# Patient Record
Sex: Male | Born: 1975 | Race: White | Hispanic: No | Marital: Married | Smoking: Current every day smoker
Health system: Southern US, Community
[De-identification: ages and names within clinical notes are randomized; demographics above are authoritative.]

## PROBLEM LIST (undated history)

## (undated) DIAGNOSIS — R161 Splenomegaly, not elsewhere classified: Secondary | ICD-10-CM

## (undated) DIAGNOSIS — D869 Sarcoidosis, unspecified: Secondary | ICD-10-CM

## (undated) DIAGNOSIS — K859 Acute pancreatitis without necrosis or infection, unspecified: Secondary | ICD-10-CM

## (undated) DIAGNOSIS — I10 Essential (primary) hypertension: Secondary | ICD-10-CM

## (undated) DIAGNOSIS — I219 Acute myocardial infarction, unspecified: Secondary | ICD-10-CM

## (undated) DIAGNOSIS — N261 Atrophy of kidney (terminal): Secondary | ICD-10-CM

## (undated) DIAGNOSIS — K219 Gastro-esophageal reflux disease without esophagitis: Secondary | ICD-10-CM

## (undated) DIAGNOSIS — I2510 Atherosclerotic heart disease of native coronary artery without angina pectoris: Secondary | ICD-10-CM

## (undated) DIAGNOSIS — W3400XA Accidental discharge from unspecified firearms or gun, initial encounter: Secondary | ICD-10-CM

## (undated) DIAGNOSIS — G459 Transient cerebral ischemic attack, unspecified: Secondary | ICD-10-CM

## (undated) DIAGNOSIS — F419 Anxiety disorder, unspecified: Secondary | ICD-10-CM

## (undated) DIAGNOSIS — N289 Disorder of kidney and ureter, unspecified: Secondary | ICD-10-CM

## (undated) HISTORY — PX: CHOLECYSTECTOMY: SHX55

## (undated) HISTORY — PX: CARDIAC CATHETERIZATION: SHX172

## (undated) HISTORY — PX: LEG SURGERY: SHX1003

---

## 2014-06-10 ENCOUNTER — Emergency Department (HOSPITAL_COMMUNITY): Payer: Non-veteran care

## 2014-06-10 ENCOUNTER — Encounter (HOSPITAL_COMMUNITY): Payer: Self-pay | Admitting: Emergency Medicine

## 2014-06-10 ENCOUNTER — Observation Stay (HOSPITAL_COMMUNITY)
Admission: EM | Admit: 2014-06-10 | Discharge: 2014-06-11 | Disposition: A | Discharge status: Home / Self Care | Payer: Non-veteran care | Attending: Internal Medicine | Admitting: Internal Medicine

## 2014-06-10 DIAGNOSIS — R109 Unspecified abdominal pain: Secondary | ICD-10-CM

## 2014-06-10 DIAGNOSIS — N281 Cyst of kidney, acquired: Secondary | ICD-10-CM | POA: Insufficient documentation

## 2014-06-10 DIAGNOSIS — N289 Disorder of kidney and ureter, unspecified: Secondary | ICD-10-CM | POA: Diagnosis not present

## 2014-06-10 DIAGNOSIS — Z79899 Other long term (current) drug therapy: Secondary | ICD-10-CM | POA: Diagnosis not present

## 2014-06-10 DIAGNOSIS — Z8673 Personal history of transient ischemic attack (TIA), and cerebral infarction without residual deficits: Secondary | ICD-10-CM | POA: Insufficient documentation

## 2014-06-10 DIAGNOSIS — R000 Tachycardia, unspecified: Secondary | ICD-10-CM | POA: Diagnosis not present

## 2014-06-10 DIAGNOSIS — I214 Non-ST elevation (NSTEMI) myocardial infarction: Secondary | ICD-10-CM

## 2014-06-10 DIAGNOSIS — I2510 Atherosclerotic heart disease of native coronary artery without angina pectoris: Secondary | ICD-10-CM | POA: Insufficient documentation

## 2014-06-10 DIAGNOSIS — F411 Generalized anxiety disorder: Secondary | ICD-10-CM | POA: Insufficient documentation

## 2014-06-10 DIAGNOSIS — R079 Chest pain, unspecified: Principal | ICD-10-CM

## 2014-06-10 DIAGNOSIS — E785 Hyperlipidemia, unspecified: Secondary | ICD-10-CM

## 2014-06-10 DIAGNOSIS — R55 Syncope and collapse: Secondary | ICD-10-CM

## 2014-06-10 DIAGNOSIS — I252 Old myocardial infarction: Secondary | ICD-10-CM | POA: Diagnosis not present

## 2014-06-10 DIAGNOSIS — R61 Generalized hyperhidrosis: Secondary | ICD-10-CM | POA: Diagnosis not present

## 2014-06-10 DIAGNOSIS — F17200 Nicotine dependence, unspecified, uncomplicated: Secondary | ICD-10-CM | POA: Insufficient documentation

## 2014-06-10 DIAGNOSIS — G459 Transient cerebral ischemic attack, unspecified: Secondary | ICD-10-CM

## 2014-06-10 DIAGNOSIS — Z905 Acquired absence of kidney: Secondary | ICD-10-CM

## 2014-06-10 HISTORY — DX: Anxiety disorder, unspecified: F41.9

## 2014-06-10 HISTORY — DX: Atherosclerotic heart disease of native coronary artery without angina pectoris: I25.10

## 2014-06-10 HISTORY — DX: Transient cerebral ischemic attack, unspecified: G45.9

## 2014-06-10 HISTORY — DX: Accidental discharge from unspecified firearms or gun, initial encounter: W34.00XA

## 2014-06-10 HISTORY — DX: Disorder of kidney and ureter, unspecified: N28.9

## 2014-06-10 LAB — CBC
HCT: 45.1 % (ref 39.0–52.0)
HEMATOCRIT: 42.2 % (ref 39.0–52.0)
Hemoglobin: 15.9 g/dL (ref 13.0–17.0)
Hemoglobin: 14.4 g/dL (ref 13.0–17.0)
MCH: 30 pg (ref 26.0–34.0)
MCH: 29 pg (ref 26.0–34.0)
MCHC: 35.3 g/dL (ref 30.0–36.0)
MCHC: 34.1 g/dL (ref 30.0–36.0)
MCV: 85.1 fL (ref 78.0–100.0)
MCV: 85.1 fL (ref 78.0–100.0)
PLATELETS: 244 10*3/uL (ref 150–400)
Platelets: 218 10*3/uL (ref 150–400)
RBC: 5.3 MIL/uL (ref 4.22–5.81)
RBC: 4.96 MIL/uL (ref 4.22–5.81)
RDW: 12.8 % (ref 11.5–15.5)
RDW: 12.8 % (ref 11.5–15.5)
WBC: 7.4 10*3/uL (ref 4.0–10.5)
WBC: 6.2 10*3/uL (ref 4.0–10.5)

## 2014-06-10 LAB — URINALYSIS, ROUTINE W REFLEX MICROSCOPIC
BILIRUBIN URINE: NEGATIVE
Glucose, UA: NEGATIVE mg/dL
KETONES UR: NEGATIVE mg/dL
LEUKOCYTES UA: NEGATIVE
NITRITE: NEGATIVE
PROTEIN: NEGATIVE mg/dL
Specific Gravity, Urine: 1.006 (ref 1.005–1.030)
UROBILINOGEN UA: 0.2 mg/dL (ref 0.0–1.0)
pH: 6 (ref 5.0–8.0)

## 2014-06-10 LAB — LIPID PANEL
Cholesterol: 163 mg/dL (ref 0–200)
HDL: 25 mg/dL — ABNORMAL LOW (ref >39)
LDL CALC: 87 mg/dL (ref 0–99)
TRIGLYCERIDES: 255 mg/dL — AB (ref <150)
Total CHOL/HDL Ratio: 6.5 RATIO
VLDL: 51 mg/dL — ABNORMAL HIGH (ref 0–40)

## 2014-06-10 LAB — I-STAT TROPONIN, ED: TROPONIN I, POC: 0 ng/mL (ref 0.00–0.08)

## 2014-06-10 LAB — CREATININE, SERUM: Creatinine, Ser: 1.02 mg/dL (ref 0.50–1.35)

## 2014-06-10 LAB — TSH: TSH: 2.34 u[IU]/mL (ref 0.350–4.500)

## 2014-06-10 LAB — RAPID URINE DRUG SCREEN, HOSP PERFORMED
Amphetamines: NOT DETECTED
BARBITURATES: NOT DETECTED
Benzodiazepines: NOT DETECTED
Cocaine: NOT DETECTED
Opiates: NOT DETECTED
Tetrahydrocannabinol: NOT DETECTED

## 2014-06-10 LAB — BASIC METABOLIC PANEL
ANION GAP: 16 — AB (ref 5–15)
BUN: 7 mg/dL (ref 6–23)
CHLORIDE: 101 meq/L (ref 96–112)
CO2: 22 mEq/L (ref 19–32)
Calcium: 9.9 mg/dL (ref 8.4–10.5)
Creatinine, Ser: 1 mg/dL (ref 0.50–1.35)
Glucose, Bld: 101 mg/dL — ABNORMAL HIGH (ref 70–99)
POTASSIUM: 3.6 meq/L — AB (ref 3.7–5.3)
SODIUM: 139 meq/L (ref 137–147)

## 2014-06-10 LAB — TROPONIN I: Troponin I: <0.3 ng/mL (ref <0.30)

## 2014-06-10 LAB — D-DIMER, QUANTITATIVE: D-Dimer, Quant: <0.27 ug/mL-FEU (ref 0.00–0.48)

## 2014-06-10 MED ORDER — VENLAFAXINE HCL ER 150 MG PO CP24
150.0000 mg | ORAL_CAPSULE | Freq: Every day | ORAL | Status: DC
  Administered 2014-06-11: 150 mg via ORAL
  Filled 2014-06-10 (×3): qty 1

## 2014-06-10 MED ORDER — ASPIRIN EC 325 MG PO TBEC
325.0000 mg | DELAYED_RELEASE_TABLET | Freq: Every day | ORAL | Status: DC
  Administered 2014-06-11: 325 mg via ORAL
  Filled 2014-06-10: qty 1

## 2014-06-10 MED ORDER — HEPARIN SODIUM (PORCINE) 5000 UNIT/ML IJ SOLN
5000.0000 [IU] | Freq: Three times a day (TID) | INTRAMUSCULAR | Status: DC
  Administered 2014-06-10: 5000 [IU] via SUBCUTANEOUS
  Filled 2014-06-10 (×4): qty 1

## 2014-06-10 MED ORDER — CLONAZEPAM 0.5 MG PO TABS
0.5000 mg | ORAL_TABLET | Freq: Every day | ORAL | Status: DC | PRN
  Administered 2014-06-10 – 2014-06-11 (×2): 0.5 mg via ORAL
  Filled 2014-06-10 (×2): qty 1

## 2014-06-10 MED ORDER — FUROSEMIDE 20 MG PO TABS
20.0000 mg | ORAL_TABLET | Freq: Every day | ORAL | Status: DC | PRN
  Filled 2014-06-10: qty 1

## 2014-06-10 MED ORDER — ATORVASTATIN CALCIUM 40 MG PO TABS
40.0000 mg | ORAL_TABLET | Freq: Every day | ORAL | Status: DC
  Filled 2014-06-10: qty 1

## 2014-06-10 MED ORDER — HYDROCODONE-ACETAMINOPHEN 7.5-325 MG PO TABS
1.0000 | ORAL_TABLET | Freq: Four times a day (QID) | ORAL | Status: DC | PRN
  Administered 2014-06-10 – 2014-06-11 (×2): 1 via ORAL
  Filled 2014-06-10 (×2): qty 1

## 2014-06-10 MED ORDER — ASPIRIN 81 MG PO CHEW
324.0000 mg | CHEWABLE_TABLET | Freq: Once | ORAL | Status: AC
  Administered 2014-06-10: 324 mg via ORAL
  Filled 2014-06-10: qty 4

## 2014-06-10 MED ORDER — VITAMIN D (ERGOCALCIFEROL) 1.25 MG (50000 UNIT) PO CAPS: 50000.0000 [IU] | ORAL_CAPSULE | ORAL | Status: DC

## 2014-06-10 MED ORDER — SODIUM CHLORIDE 0.9 % IJ SOLN
3.0000 mL | Freq: Two times a day (BID) | INTRAMUSCULAR | Status: DC
  Administered 2014-06-10: 3 mL via INTRAVENOUS

## 2014-06-10 MED ORDER — PANTOPRAZOLE SODIUM 40 MG PO TBEC
40.0000 mg | DELAYED_RELEASE_TABLET | Freq: Every day | ORAL | Status: DC
  Administered 2014-06-11: 40 mg via ORAL
  Filled 2014-06-10: qty 1

## 2014-06-10 MED ORDER — LISINOPRIL 20 MG PO TABS
20.0000 mg | ORAL_TABLET | Freq: Every day | ORAL | Status: DC
  Filled 2014-06-10 (×3): qty 1

## 2014-06-10 NOTE — ED Notes (Signed)
Security paged to escort pt to car to obtain cell phone. PA nicole made aware and states this is okay.
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
## 2014-06-10 NOTE — ED Notes (Signed)
Pt c/o left flank pain near kidney area into chest; pt sts hx of tachycardia; pt sts history of only one kidney
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
## 2014-06-10 NOTE — ED Notes (Signed)
Cardiology at bedside.

## 2014-06-11 DIAGNOSIS — G459 Transient cerebral ischemic attack, unspecified: Secondary | ICD-10-CM

## 2014-06-11 DIAGNOSIS — E785 Hyperlipidemia, unspecified: Secondary | ICD-10-CM

## 2014-06-11 DIAGNOSIS — I214 Non-ST elevation (NSTEMI) myocardial infarction: Secondary | ICD-10-CM

## 2014-06-11 DIAGNOSIS — R079 Chest pain, unspecified: Secondary | ICD-10-CM | POA: Diagnosis not present

## 2014-06-11 DIAGNOSIS — I517 Cardiomegaly: Secondary | ICD-10-CM

## 2014-06-11 DIAGNOSIS — Z905 Acquired absence of kidney: Secondary | ICD-10-CM

## 2014-06-11 DIAGNOSIS — R072 Precordial pain: Secondary | ICD-10-CM

## 2014-06-11 DIAGNOSIS — I252 Old myocardial infarction: Secondary | ICD-10-CM

## 2014-06-11 DIAGNOSIS — R109 Unspecified abdominal pain: Secondary | ICD-10-CM

## 2014-06-11 LAB — CBC WITH DIFFERENTIAL/PLATELET
Basophils Absolute: 0 10*3/uL (ref 0.0–0.1)
Basophils Relative: 1 % (ref 0–1)
EOS ABS: 0.1 10*3/uL (ref 0.0–0.7)
EOS PCT: 2 % (ref 0–5)
HCT: 42.5 % (ref 39.0–52.0)
Hemoglobin: 14.6 g/dL (ref 13.0–17.0)
LYMPHS ABS: 2.9 10*3/uL (ref 0.7–4.0)
Lymphocytes Relative: 45 % (ref 12–46)
MCH: 29.3 pg (ref 26.0–34.0)
MCHC: 34.4 g/dL (ref 30.0–36.0)
MCV: 85.3 fL (ref 78.0–100.0)
MONO ABS: 0.5 10*3/uL (ref 0.1–1.0)
Monocytes Relative: 7 % (ref 3–12)
Neutro Abs: 2.8 10*3/uL (ref 1.7–7.7)
Neutrophils Relative %: 45 % (ref 43–77)
PLATELETS: 197 10*3/uL (ref 150–400)
RBC: 4.98 MIL/uL (ref 4.22–5.81)
RDW: 12.9 % (ref 11.5–15.5)
WBC: 6.3 10*3/uL (ref 4.0–10.5)

## 2014-06-11 LAB — PRO B NATRIURETIC PEPTIDE: Pro B Natriuretic peptide (BNP): 13.9 pg/mL (ref 0–125)

## 2014-06-11 LAB — COMPREHENSIVE METABOLIC PANEL
ALBUMIN: 3.7 g/dL (ref 3.5–5.2)
ALT: 48 U/L (ref 0–53)
AST: 51 U/L — AB (ref 0–37)
Alkaline Phosphatase: 126 U/L — ABNORMAL HIGH (ref 39–117)
Anion gap: 14 (ref 5–15)
BUN: 6 mg/dL (ref 6–23)
CALCIUM: 9.1 mg/dL (ref 8.4–10.5)
CHLORIDE: 105 meq/L (ref 96–112)
CO2: 24 meq/L (ref 19–32)
Creatinine, Ser: 0.91 mg/dL (ref 0.50–1.35)
GFR calc Af Amer: >90 mL/min (ref >90)
Glucose, Bld: 103 mg/dL — ABNORMAL HIGH (ref 70–99)
Potassium: 3.7 mEq/L (ref 3.7–5.3)
SODIUM: 143 meq/L (ref 137–147)
Total Bilirubin: 0.5 mg/dL (ref 0.3–1.2)
Total Protein: 6.7 g/dL (ref 6.0–8.3)

## 2014-06-11 LAB — HEMOGLOBIN A1C
HEMOGLOBIN A1C: 5.6 % (ref <5.7)
Mean Plasma Glucose: 114 mg/dL (ref <117)

## 2014-06-11 LAB — TROPONIN I
Troponin I: <0.3 ng/mL (ref <0.30)
Troponin I: <0.3 ng/mL (ref <0.30)

## 2014-06-11 NOTE — Progress Notes (Signed)
  Echocardiogram 2D Echocardiogram has been performed.  Cathie Beams 06/11/2014, 11:36 AM

## 2014-06-11 NOTE — ED Provider Notes (Signed)
Medical screening examination/treatment/procedure(s) were performed by non-physician practitioner and as supervising physician I was immediately available for consultation/collaboration.  Shadi Larner L Salley Boxley, MD 06/11/14 0035 

## 2014-06-11 NOTE — Discharge Summary (Signed)
Physician Discharge Summary  Kevin [name] ZOX:[id number] DOB: 1975-12-22 DOA: 06/10/2014  PCP: No primary provider on file.  Admit date: 06/10/2014 Discharge date: 06/11/2014  Time spent: 35 minutes  Recommendations for Outpatient Follow-up:  1. Follow up with PCP in 1-2 weeks  Discharge Diagnoses:  Active Problems:   Chest pain   Flank pain   NSTEMI (non-ST elevated myocardial infarction)   TIA (transient ischemic attack)   HLD (hyperlipidemia)   Discharge Condition: Stable  Diet recommendation: Heart Healthy  Filed Weights   06/10/14 2330 06/11/14 0500  Weight: 82.691 kg (182 lb 4.8 oz) 82.736 kg (182 lb 6.4 oz)    History of present illness:  See admit h and p from 8/24 for details. Briefly, pt presents with chest and flank pain in the setting of piror cardiac history. Pt was admitted for further work up and management,.  Hospital Course:  Chest pain  1. Troponin serially neg 2. Cardiology following 3. 2d echo was done and was unremarkable 4. Outpatient records reviewed and determined that no further cardiac work up is needed at this time Flank pain  1. Neg KUB per dictated read. Stool noted near hepatic flexure per my own read 2. UA unremarkable 3. Tox screen neg HTN  1. BP stable and controlled 2. On lisinopril DVT prophylaxis  1. Heparin subQ  Consultations:  Cardiology  Discharge Exam: Filed Vitals:   06/10/14 2227 06/10/14 2330 06/11/14 0500 06/11/14 1352  BP: 139/98 127/83 105/69 127/82  Pulse:  82 67 93  Temp:  98.6 F (37 C) 97.7 F (36.5 C) 98.2 F (36.8 C)  TempSrc:  Oral Oral Oral  Resp:  Height:   (1.727 m)    Weight:  82.691 kg (182 lb 4.8 oz) 82.736 kg (182 lb 6.4 oz)   SpO2:  100% 99% 99%    General: Awake, in nad Cardiovascular: regular, s1, s2 Respiratory: normal resp effort, no wheezing  Discharge Instructions     Medication List         atorvastatin 40 MG tablet  Commonly known as:  LIPITOR  Take  40 mg by mouth daily at 6 PM.     clonazePAM 0.5 MG tablet  Commonly known as:  KLONOPIN  Take 0.5 mg by mouth daily as needed for anxiety.     furosemide 20 MG tablet  Commonly known as:  LASIX  Take 20 mg by mouth daily as needed (for kidney).     HYDROcodone-acetaminophen 7.5-325 MG per tablet  Commonly known as:  NORCO  Take 1 tablet by mouth every 6 (six) hours as needed for moderate pain.     lisinopril 20 MG tablet  Commonly known as:  PRINIVIL,ZESTRIL  Take 20 mg by mouth daily.     omeprazole 20 MG capsule  Commonly known as:  PRILOSEC  Take 20 mg by mouth daily.     venlafaxine XR 150 MG 24 hr capsule  Commonly known as:  EFFEXOR-XR  Take 150 mg by mouth daily with breakfast.     Vitamin D (Ergocalciferol) 50000 UNITS Caps capsule  Commonly known as:  DRISDOL  Take 50,000 Units by mouth every Sunday.       Not on File Follow-up Information   Follow up with Follow up with your PCP in 1-2 weeks. Schedule an appointment as soon as possible for a visit in 1 week.       The results of significant diagnostics from this hospitalization (including imaging, microbiology,  ancillary and laboratory) are listed below for reference.    Significant Diagnostic Studies: Dg Chest 2 View  06/10/2014   CLINICAL DATA:  Left flank pain  EXAM: CHEST  2 VIEW  COMPARISON:  None.  FINDINGS: The heart size and mediastinal contours are within normal limits. Both lungs are clear. The visualized skeletal structures are unremarkable.  IMPRESSION: No active cardiopulmonary disease.   Electronically Signed   By: Signa Kell M.D.   On: 06/10/2014 18:34   Dg Abd 1 View  06/10/2014   CLINICAL DATA:  Chest pain and shortness of breath.  Blood in urine.  EXAM: ABDOMEN - 1 VIEW  COMPARISON:  None.  FINDINGS: The bowel gas pattern is normal. No radio-opaque calculi or other significant radiographic abnormality are seen.  IMPRESSION: Negative.   Electronically Signed   By: Burman Nieves M.D.    On: 06/10/2014 22:13    Microbiology: No results found for this or any previous visit (from the past 240 hour(s)).   Labs: Basic Metabolic Panel:  Recent Labs Lab 06/10/14 1702 06/10/14 2237 06/11/14 0353  NA 139  --  143  K 3.6*  --  3.7  CL 101  --  105  CO2 22  --  24  GLUCOSE 101*  --  103*  BUN 7  --  6  CREATININE 1.00 1.02 0.91  CALCIUM 9.9  --  9.1   Liver Function Tests:  Recent Labs Lab 06/11/14 0353  AST 51*  ALT 48  ALKPHOS 126*  BILITOT 0.5  PROT 6.7  ALBUMIN 3.7   No results found for this basename: LIPASE, AMYLASE,  in the last 168 hours No results found for this basename: AMMONIA,  in the last 168 hours CBC:  Recent Labs Lab 06/10/14 1702 06/10/14 2237 06/11/14 0353  WBC 7.4 6.2 6.3  NEUTROABS  --   --  2.8  HGB 15.9 14.4 14.6  HCT 45.1 42.2 42.5  MCV 85.1 85.1 85.3  PLT 244 218 197   Cardiac Enzymes:  Recent Labs Lab 06/10/14 2237 06/11/14 0353 06/11/14 1056  TROPONINI <0.30 <0.30 <0.30   BNP: BNP (last 3 results)  Recent Labs  06/11/14 1056  PROBNP 13.9   CBG: No results found for this basename: GLUCAP,  in the last 168 hours  Signed:  Moishy Laday K  [facility] 06/11/2014, 6:11 PM

## 2014-06-11 NOTE — Progress Notes (Signed)
Received records from [place] this evening.   1. Cardiac cath 08/29/2013: -LM large caliber vessel  W/o any significant stenosis. Gives rise to LAD and LCx. -LAD large caliber vessel reaching the apex. It gave rise to one diagonal branch and other small diagonal brnaches. No significant stenosis.  -LCx large caliber vessel and was a codominant system. Gave rise to a large OM1 branch and continued in the AV groove as a small caliber vessel. No significant stenosis.  -RCA was a large caliber vessel and was a codominant system. No significant stenosis.  -Conclusions: Normal coronary angiography -Recommendations: Continue aggressive risk factor management modification, including smoking cessation.   Echo 08/27/2013:  EF >60% LV is normal in size. No regional wall motion abnormalities.    Eula Listen, PA-C 06/11/2014 5:49 PM

## 2014-06-11 NOTE — Progress Notes (Signed)
[facility] PROGRESS NOTE  Kevin [name] ZOX:[id number] DOB: 06/24/1976 DOA: 06/10/2014 PCP: No primary provider on file.  Assessment/Plan: 1. Chest pain 1. Troponin serially neg 2. Cardiology following 3. Await 2d echo results 4. Cardiology plan for obtaining outpt records from [place] and f/u on 2d echo 2. Flank pain 1. Neg KUB per read. Stool noted near hepatic flexure per my own read 2. UA unremarkable 3. Tox screen neg 3. HTN 1. BP stable and controlled 2. On lisinopril 4. DVT prophylaxis 1. Heparin subQ  Code Status: Full Family Communication: Pt in room (indicate person spoken with, relationship, and if by phone, the number) Disposition Plan: Pending   Consultants:  Cardiology  Procedures:    Antibiotics:   (indicate start date, and stop date if known)  HPI/Subjective: No acute events noted overnight. No complaints  Objective: Filed Vitals:   06/10/14 2227 06/10/14 2330 06/11/14 0500 06/11/14 1352  BP: 139/98 127/83 105/69 127/82  Pulse:  82 67 93  Temp:  98.6 F (37 C) 97.7 F (36.5 C) 98.2 F (36.8 C)  TempSrc:  Oral Oral Oral  Resp:  Height:   (1.727 m)    Weight:  82.691 kg (182 lb 4.8 oz) 82.736 kg (182 lb 6.4 oz)   SpO2:  100% 99% 99%    Intake/Output Summary (Last 24 hours) at 06/11/14 1519 Last data filed at 06/11/14 1353  Gross per 24 hour  Intake      3 ml  Output   1200 ml  Net  -1197 ml   Filed Weights   06/10/14 2330 06/11/14 0500  Weight: 82.691 kg (182 lb 4.8 oz) 82.736 kg (182 lb 6.4 oz)    Exam:   General:  Awake, in nad  Cardiovascular: regular, s1, s2  Respiratory: normal resp effort, no wheezing  Abdomen: soft,nondistended  Musculoskeletal: perfused, no clubbing   Data Reviewed: Basic Metabolic Panel:  Recent Labs Lab 06/10/14 1702 06/10/14 2237 06/11/14 0353  NA 139  --  143  K 3.6*  --  3.7  CL 101  --  105  CO2 22  --  24  GLUCOSE 101*  --  103*  BUN 7  --  6   CREATININE 1.00 1.02 0.91  CALCIUM 9.9  --  9.1   Liver Function Tests:  Recent Labs Lab 06/11/14 0353  AST 51*  ALT 48  ALKPHOS 126*  BILITOT 0.5  PROT 6.7  ALBUMIN 3.7   No results found for this basename: LIPASE, AMYLASE,  in the last 168 hours No results found for this basename: AMMONIA,  in the last 168 hours CBC:  Recent Labs Lab 06/10/14 1702 06/10/14 2237 06/11/14 0353  WBC 7.4 6.2 6.3  NEUTROABS  --   --  2.8  HGB 15.9 14.4 14.6  HCT 45.1 42.2 42.5  MCV 85.1 85.1 85.3  PLT 244 218 197   Cardiac Enzymes:  Recent Labs Lab 06/10/14 2237 06/11/14 0353 06/11/14 1056  TROPONINI <0.30 <0.30 <0.30   BNP (last 3 results)  Recent Labs  06/11/14 1056  PROBNP 13.9   CBG: No results found for this basename: GLUCAP,  in the last 168 hours  No results found for this or any previous visit (from the past 240 hour(s)).   Studies: Dg Chest 2 View  06/10/2014   CLINICAL DATA:  Left flank pain  EXAM: CHEST  2 VIEW  COMPARISON:  None.  FINDINGS: The heart size and mediastinal contours are within  normal limits. Both lungs are clear. The visualized skeletal structures are unremarkable.  IMPRESSION: No active cardiopulmonary disease.   Electronically Signed   By: Signa Kell M.D.   On: 06/10/2014 18:34   Dg Abd 1 View  06/10/2014   CLINICAL DATA:  Chest pain and shortness of breath.  Blood in urine.  EXAM: ABDOMEN - 1 VIEW  COMPARISON:  None.  FINDINGS: The bowel gas pattern is normal. No radio-opaque calculi or other significant radiographic abnormality are seen.  IMPRESSION: Negative.   Electronically Signed   By: Burman Nieves M.D.   On: 06/10/2014 22:13    Scheduled Meds: . aspirin EC  325 mg Oral Daily  . atorvastatin  40 mg Oral q1800  . heparin  5,000 Units Subcutaneous 3 times per day  . lisinopril  20 mg Oral Daily  . pantoprazole  40 mg Oral Daily  . sodium chloride  3 mL Intravenous Q12H  . venlafaxine XR  150 mg Oral Q breakfast  . [START ON  06/16/2014] Vitamin D (Ergocalciferol)  50,000 Units Oral Q Sun   Continuous Infusions:   Active Problems:   Chest pain   Flank pain   NSTEMI (non-ST elevated myocardial infarction)   TIA (transient ischemic attack)   HLD (hyperlipidemia)  Time spent:  CHIU, STEPHEN K  [facility] Pager [phone]. If 7PM-7AM, please contact night-coverage at [email or web address], password [facility] 06/11/2014, 3:19 PM  LOS: 1 day

## 2014-06-11 NOTE — Progress Notes (Signed)
Patient: Kevin [name] / Admit Date: 06/10/2014 / Date of Encounter: 06/11/2014, 7:53 AM   Subjective: No chest pain, SOB, palpitations, or dyspnea.    Objective: Telemetry: NSR, 60 Physical Exam: Blood pressure 105/69, pulse 67, temperature 97.7 F (36.5 C), temperature source Oral, resp. rate 18, height  (1.727 m), weight 182 lb 6.4 oz (82.736 kg), SpO2 99.00%. General: Well developed, well nourished, in no acute distress. Head: Normocephalic, atraumatic, sclera non-icteric, no xanthomas, nares are without discharge. Neck: Negative for carotid bruits. JVP not elevated. Lungs: Clear bilaterally to auscultation without wheezes, rales, or rhonchi. Breathing is unlabored. Heart: RRR S1 S2 without murmurs, rubs, or gallops.  Abdomen: Soft, non-tender, non-distended with normoactive bowel sounds. No rebound/guarding. Extremities: No clubbing or cyanosis. No edema. Distal pedal pulses are 2+ and equal bilaterally. Neuro: Alert and oriented X 3. Moves all extremities spontaneously. Psych:  Responds to questions appropriately with a normal affect.   Intake/Output Summary (Last 24 hours) at 06/11/14 0753 Last data filed at 06/11/14 0554  Gross per 24 hour  Intake      3 ml  Output    400 ml  Net   -397 ml    Inpatient Medications:  . aspirin EC  325 mg Oral Daily  . atorvastatin  40 mg Oral q1800  . heparin  5,000 Units Subcutaneous 3 times per day  . lisinopril  20 mg Oral Daily  . pantoprazole  40 mg Oral Daily  . sodium chloride  3 mL Intravenous Q12H  . venlafaxine XR  150 mg Oral Q breakfast  . [START ON 06/16/2014] Vitamin D (Ergocalciferol)  50,000 Units Oral Q Sun   Infusions:    Labs:  Recent Labs  06/10/14 1702 06/10/14 2237 06/11/14 0353  NA 139  --  143  K 3.6*  --  3.7  CL 101  --  105  CO2 22  --  24  GLUCOSE 101*  --  103*  BUN 7  --  6  CREATININE 1.00 1.02 0.91  CALCIUM 9.9  --  9.1    Recent Labs  06/11/14 0353  AST 51*  ALT 48    ALKPHOS 126*  BILITOT 0.5  PROT 6.7  ALBUMIN 3.7    Recent Labs  06/10/14 2237 06/11/14 0353  WBC 6.2 6.3  NEUTROABS  --  2.8  HGB 14.4 14.6  HCT 42.2 42.5  MCV 85.1 85.3  PLT 218 197    Recent Labs  06/10/14 2237 06/11/14 0353  TROPONINI <0.30 <0.30   No components found with this basename: POCBNP,  No results found for this basename: HGBA1C,  in the last 72 hours   Radiology/Studies:  Dg Chest 2 View  06/10/2014   CLINICAL DATA:  Left flank pain  EXAM: CHEST  2 VIEW  COMPARISON:  None.  FINDINGS: The heart size and mediastinal contours are within normal limits. Both lungs are clear. The visualized skeletal structures are unremarkable.  IMPRESSION: No active cardiopulmonary disease.   Electronically Signed   By: Signa Kell M.D.   On: 06/10/2014 18:34   Dg Abd 1 View  06/10/2014   CLINICAL DATA:  Chest pain and shortness of breath.  Blood in urine.  EXAM: ABDOMEN - 1 VIEW  COMPARISON:  None.  FINDINGS: The bowel gas pattern is normal. No radio-opaque calculi or other significant radiographic abnormality are seen.  IMPRESSION: Negative.   Electronically Signed   By: Burman Nieves M.D.   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 Pain was similar to his previous NSTEMI pain in 2014. Troponin negative x 2 to date. EKG sinus tach, 128, nonspecific st/t changes. Currently chest pain free and in NSR, 60s.  1. Chest pain with moderate risk for cardiac etiology: -Currently chest pain free  -Order echo -Will try and get prior records from [facility]-patient's copies are in transit from [place] to [facility] -Will discuss invasive vs noninvasive  approach with MD-SCr 0.91 -UDS-states he has always been tachycardic since age 38 -He prefers to NOT have stress test  2. 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 He did not follow up with cardiology afterwards 2/2 just after his cath he suffered a TIA, followed by R kidney atrophy. States he just had too much going on.  -TC 163, LDL 87, HDL 25, trigs 255- on Lipitor 40 mg  -A1C pending -On aspirin 81 mg  3. HTN -Controlled -On lisinopril 20 mg   4. FH CAD -Brother died of MI at age 38, father with multiple MI and 4 v CABG (first MI at 66). Several cousins with MIs in lower 40s.   5. H/o tachycardia  -Patient reports longstanding issue since age 38 -Apparently wore a Holter monitor 08/2013, records have been requested -Sinus tach upon admission, currently NSR -Consider outpatient monitor  6. Tobacco abuse -Previously smoked 2ppd, currently 5 cigarettes per day. Counseled            Signed, Eula Listen, PA-C 06/11/2014 8:01 AM  History and all data above reviewed.  Patient examined.  I agree with the findings as above.  He had some vague chest discomfort on admission.  The patient exam reveals COR:RRR  ,  Lungs: Clear  ,  Abd: Positive bowel sounds, no rebound no guarding, Ext No edema  .  All available labs, radiology testing, previous records reviewed. Agree with documented assessment and plan. Chest pain:  Atypical.  I think we will start with an echocardiogram and outside records.  I am not inclined to consider a cath since he had one last fall.  I would rather get these records.  He need to understand his anatomy in the face of this very significant family history.     Kevin [name]  11:00 AM  06/11/2014

## 2014-06-11 NOTE — Progress Notes (Signed)
UR completed 

## 2014-06-11 NOTE — Progress Notes (Signed)
2-D echo reviewed as below from today.  Study Conclusions - Left ventricle: The cavity size was normal. There was mild focal basal hypertrophy of the septum. Systolic function was normal. The estimated ejection fraction was in the range of 55% to 60%. Wall motion was normal; there were no regional wall motion abnormalities. - Mitral valve: There was trivial regurgitation.  Reviewed with Dr. Antoine Poche. Given atypical nature of chest pain, normal cath in 08/2013, and reassuring echo no further cardiac work up is needed at present time. Relayed results to patient.  Patient already has f/u with urology for microscopic Hgb on UA.  Eula Listen PA-C 06/11/2014 6:15 PM
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
## 2014-06-20 ENCOUNTER — Encounter (HOSPITAL_COMMUNITY): Payer: Self-pay | Admitting: Emergency Medicine

## 2014-06-20 ENCOUNTER — Emergency Department (HOSPITAL_COMMUNITY)
Admission: EM | Admit: 2014-06-20 | Discharge: 2014-06-20 | Disposition: A | Discharge status: Home / Self Care | Payer: Non-veteran care | Attending: Emergency Medicine | Admitting: Emergency Medicine

## 2014-06-20 ENCOUNTER — Emergency Department (HOSPITAL_COMMUNITY): Payer: Non-veteran care

## 2014-06-20 DIAGNOSIS — Z8673 Personal history of transient ischemic attack (TIA), and cerebral infarction without residual deficits: Secondary | ICD-10-CM | POA: Insufficient documentation

## 2014-06-20 DIAGNOSIS — R079 Chest pain, unspecified: Secondary | ICD-10-CM | POA: Insufficient documentation

## 2014-06-20 DIAGNOSIS — R350 Frequency of micturition: Secondary | ICD-10-CM | POA: Diagnosis not present

## 2014-06-20 DIAGNOSIS — Z79899 Other long term (current) drug therapy: Secondary | ICD-10-CM | POA: Insufficient documentation

## 2014-06-20 DIAGNOSIS — R61 Generalized hyperhidrosis: Secondary | ICD-10-CM | POA: Insufficient documentation

## 2014-06-20 DIAGNOSIS — I2510 Atherosclerotic heart disease of native coronary artery without angina pectoris: Secondary | ICD-10-CM | POA: Diagnosis not present

## 2014-06-20 DIAGNOSIS — F411 Generalized anxiety disorder: Secondary | ICD-10-CM | POA: Diagnosis not present

## 2014-06-20 DIAGNOSIS — R1012 Left upper quadrant pain: Secondary | ICD-10-CM | POA: Diagnosis not present

## 2014-06-20 DIAGNOSIS — Z87448 Personal history of other diseases of urinary system: Secondary | ICD-10-CM | POA: Insufficient documentation

## 2014-06-20 DIAGNOSIS — F17200 Nicotine dependence, unspecified, uncomplicated: Secondary | ICD-10-CM | POA: Diagnosis not present

## 2014-06-20 DIAGNOSIS — Z87828 Personal history of other (healed) physical injury and trauma: Secondary | ICD-10-CM | POA: Diagnosis not present

## 2014-06-20 DIAGNOSIS — R110 Nausea: Secondary | ICD-10-CM | POA: Diagnosis not present

## 2014-06-20 LAB — CBC
HEMATOCRIT: 42.4 % (ref 39.0–52.0)
HEMOGLOBIN: 15.2 g/dL (ref 13.0–17.0)
MCH: 30.5 pg (ref 26.0–34.0)
MCHC: 35.8 g/dL (ref 30.0–36.0)
MCV: 85.1 fL (ref 78.0–100.0)
Platelets: 224 10*3/uL (ref 150–400)
RBC: 4.98 MIL/uL (ref 4.22–5.81)
RDW: 12.8 % (ref 11.5–15.5)
WBC: 6.6 10*3/uL (ref 4.0–10.5)

## 2014-06-20 LAB — URINALYSIS, ROUTINE W REFLEX MICROSCOPIC
Bilirubin Urine: NEGATIVE
Glucose, UA: NEGATIVE mg/dL
Hgb urine dipstick: NEGATIVE
Ketones, ur: NEGATIVE mg/dL
LEUKOCYTES UA: NEGATIVE
NITRITE: NEGATIVE
PH: 6 (ref 5.0–8.0)
PROTEIN: NEGATIVE mg/dL
Specific Gravity, Urine: 1.003 — ABNORMAL LOW (ref 1.005–1.030)
Urobilinogen, UA: 0.2 mg/dL (ref 0.0–1.0)

## 2014-06-20 LAB — BASIC METABOLIC PANEL
Anion gap: 15 (ref 5–15)
BUN: 6 mg/dL (ref 6–23)
CHLORIDE: 101 meq/L (ref 96–112)
CO2: 24 mEq/L (ref 19–32)
Calcium: 9.1 mg/dL (ref 8.4–10.5)
Creatinine, Ser: 0.93 mg/dL (ref 0.50–1.35)
GFR calc non Af Amer: >90 mL/min (ref >90)
GLUCOSE: 105 mg/dL — AB (ref 70–99)
Potassium: 3.7 mEq/L (ref 3.7–5.3)
SODIUM: 140 meq/L (ref 137–147)

## 2014-06-20 LAB — I-STAT TROPONIN, ED: Troponin i, poc: 0 ng/mL (ref 0.00–0.08)

## 2014-06-20 MED ORDER — OXYCODONE-ACETAMINOPHEN 5-325 MG PO TABS: 1.0000 | ORAL_TABLET | ORAL | Status: DC | PRN

## 2014-06-20 NOTE — ED Notes (Signed)
Pt states he recently moved here from [place], where he was supposed to have an MRI for an "atrophied kidney." Pt was admitted recently for chest pain but left AMA. Pt states he has been having pain to his only good kidney, the left, and then the pain is going up into his chest. Pt associates chest pain to kidney. Pt also reporting left sided lower back pain. Pt requests an MRI.

## 2014-06-20 NOTE — ED Notes (Signed)
Pt c/o mid sternal CP with "choking sensation"; pt sts signed out AMA last week without having stress test

## 2014-06-20 NOTE — Discharge Instructions (Signed)
Percocet as prescribed as needed for pain.  Followup with a primary Dr. in the next week for followup.   Abdominal Pain Many things can cause abdominal pain. Usually, abdominal pain is not caused by a disease and will improve without treatment. It can often be observed and treated at home. Your health care provider will do a physical exam and possibly order blood tests and X-rays to help determine the seriousness of your pain. However, in many cases, more time must pass before a clear cause of the pain can be found. Before that point, your health care provider may not know if you need more testing or further treatment. HOME CARE INSTRUCTIONS  Monitor your abdominal pain for any changes. The following actions may help to alleviate any discomfort you are experiencing:  Only take over-the-counter or prescription medicines as directed by your health care provider.  Do not take laxatives unless directed to do so by your health care provider.  Try a clear liquid diet (broth, tea, or water) as directed by your health care provider. Slowly move to a bland diet as tolerated. SEEK MEDICAL CARE IF:  You have unexplained abdominal pain.  You have abdominal pain associated with nausea or diarrhea.  You have pain when you urinate or have a bowel movement.  You experience abdominal pain that wakes you in the night.  You have abdominal pain that is worsened or improved by eating food.  You have abdominal pain that is worsened with eating fatty foods.  You have a fever. SEEK IMMEDIATE MEDICAL CARE IF:   Your pain does not go away within 2 hours.  You keep throwing up (vomiting).  Your pain is felt only in portions of the abdomen, such as the right side or the left lower portion of the abdomen.  You pass bloody or black tarry stools. MAKE SURE YOU:  Understand these instructions.   Will watch your condition.   Will get help right away if you are not doing well or get worse.  Document  Released: 07/14/2005 Document Revised: 10/09/2013 Document Reviewed: 06/13/2013 [facility] Patient Information 2015 [place], [place]. This information is not intended to replace advice given to you by your health care provider. Make sure you discuss any questions you have with your health care provider.
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
## 2014-09-05 ENCOUNTER — Observation Stay: Payer: Self-pay | Admitting: Internal Medicine

## 2014-09-05 LAB — TROPONIN I

## 2014-09-05 LAB — CBC
HCT: 44.8 % (ref 40.0–52.0)
HGB: 15.2 g/dL (ref 13.0–18.0)
MCH: 30.8 pg (ref 26.0–34.0)
MCHC: 33.9 g/dL (ref 32.0–36.0)
MCV: 91 fL (ref 80–100)
Platelet: 210 10*3/uL (ref 150–440)
RBC: 4.94 10*6/uL (ref 4.40–5.90)
RDW: 12.9 % (ref 11.5–14.5)
WBC: 6.9 10*3/uL (ref 3.8–10.6)

## 2014-09-05 LAB — COMPREHENSIVE METABOLIC PANEL
ALBUMIN: 3.7 g/dL (ref 3.4–5.0)
ANION GAP: 9 (ref 7–16)
Alkaline Phosphatase: 123 U/L — ABNORMAL HIGH
BILIRUBIN TOTAL: 0.5 mg/dL (ref 0.2–1.0)
BUN: 11 mg/dL (ref 7–18)
CALCIUM: 8.6 mg/dL (ref 8.5–10.1)
CO2: 25 mmol/L (ref 21–32)
CREATININE: 1.13 mg/dL (ref 0.60–1.30)
Chloride: 104 mmol/L (ref 98–107)
EGFR (African American): >60
GLUCOSE: 108 mg/dL — AB (ref 65–99)
Osmolality: 276 (ref 275–301)
Potassium: 4.1 mmol/L (ref 3.5–5.1)
SGOT(AST): 29 U/L (ref 15–37)
SGPT (ALT): 23 U/L
SODIUM: 138 mmol/L (ref 136–145)
TOTAL PROTEIN: 6.9 g/dL (ref 6.4–8.2)

## 2014-09-05 LAB — DRUG SCREEN, URINE
Amphetamines, Ur Screen: POSITIVE (ref ?–1000)
Barbiturates, Ur Screen: NEGATIVE (ref ?–200)
Benzodiazepine, Ur Scrn: POSITIVE (ref ?–200)
Cannabinoid 50 Ng, Ur ~~LOC~~: NEGATIVE (ref ?–50)
Cocaine Metabolite,Ur ~~LOC~~: NEGATIVE (ref ?–300)
MDMA (Ecstasy)Ur Screen: NEGATIVE (ref ?–500)
Methadone, Ur Screen: NEGATIVE (ref ?–300)
Opiate, Ur Screen: NEGATIVE (ref ?–300)
Phencyclidine (PCP) Ur S: NEGATIVE (ref ?–25)
TRICYCLIC, UR SCREEN: NEGATIVE (ref ?–1000)

## 2014-10-14 ENCOUNTER — Emergency Department: Payer: Self-pay | Admitting: Emergency Medicine

## 2014-10-14 LAB — COMPREHENSIVE METABOLIC PANEL
ALK PHOS: 127 U/L — AB
ALT: 57 U/L
AST: 24 U/L (ref 15–37)
Albumin: 3.8 g/dL (ref 3.4–5.0)
Anion Gap: 10 (ref 7–16)
BILIRUBIN TOTAL: 0.2 mg/dL (ref 0.2–1.0)
BUN: 6 mg/dL — ABNORMAL LOW (ref 7–18)
CHLORIDE: 104 mmol/L (ref 98–107)
CO2: 25 mmol/L (ref 21–32)
Calcium, Total: 8.7 mg/dL (ref 8.5–10.1)
Creatinine: 1.02 mg/dL (ref 0.60–1.30)
EGFR (African American): >60
EGFR (Non-African Amer.): >60
GLUCOSE: 106 mg/dL — AB (ref 65–99)
Osmolality: 276 (ref 275–301)
Potassium: 3.5 mmol/L (ref 3.5–5.1)
SODIUM: 139 mmol/L (ref 136–145)
TOTAL PROTEIN: 6.9 g/dL (ref 6.4–8.2)

## 2014-10-14 LAB — URINALYSIS, COMPLETE
BLOOD: NEGATIVE
Bacteria: NONE SEEN
Bilirubin,UR: NEGATIVE
Glucose,UR: NEGATIVE mg/dL (ref 0–75)
Ketone: NEGATIVE
LEUKOCYTE ESTERASE: NEGATIVE
Nitrite: NEGATIVE
Ph: 6 (ref 4.5–8.0)
Protein: NEGATIVE
RBC,UR: NONE SEEN /HPF (ref 0–5)
Specific Gravity: 1.01 (ref 1.003–1.030)
Squamous Epithelial: NONE SEEN
WBC UR: <1 /HPF (ref 0–5)

## 2014-10-14 LAB — DRUG SCREEN, URINE
Amphetamines, Ur Screen: NEGATIVE (ref ?–1000)
BENZODIAZEPINE, UR SCRN: POSITIVE (ref ?–200)
Barbiturates, Ur Screen: NEGATIVE (ref ?–200)
CANNABINOID 50 NG, UR ~~LOC~~: NEGATIVE (ref ?–50)
Cocaine Metabolite,Ur ~~LOC~~: NEGATIVE (ref ?–300)
MDMA (Ecstasy)Ur Screen: NEGATIVE (ref ?–500)
METHADONE, UR SCREEN: NEGATIVE (ref ?–300)
Opiate, Ur Screen: POSITIVE (ref ?–300)
Phencyclidine (PCP) Ur S: NEGATIVE (ref ?–25)
TRICYCLIC, UR SCREEN: NEGATIVE (ref ?–1000)

## 2014-10-14 LAB — CBC
HCT: 46 % (ref 40.0–52.0)
HGB: 15.9 g/dL (ref 13.0–18.0)
MCH: 30.1 pg (ref 26.0–34.0)
MCHC: 34.5 g/dL (ref 32.0–36.0)
MCV: 87 fL (ref 80–100)
Platelet: 206 10*3/uL (ref 150–440)
RBC: 5.27 10*6/uL (ref 4.40–5.90)
RDW: 12.6 % (ref 11.5–14.5)
WBC: 6.1 10*3/uL (ref 3.8–10.6)

## 2014-10-14 LAB — LIPASE, BLOOD: Lipase: 196 U/L (ref 73–393)

## 2014-10-31 ENCOUNTER — Emergency Department: Payer: Self-pay | Admitting: Emergency Medicine

## 2014-10-31 LAB — BASIC METABOLIC PANEL
ANION GAP: 7 (ref 7–16)
BUN: 10 mg/dL (ref 7–18)
CALCIUM: 8.6 mg/dL (ref 8.5–10.1)
Chloride: 102 mmol/L (ref 98–107)
Co2: 29 mmol/L (ref 21–32)
Creatinine: 1.07 mg/dL (ref 0.60–1.30)
EGFR (African American): >60
GLUCOSE: 122 mg/dL — AB (ref 65–99)
OSMOLALITY: 276 (ref 275–301)
Potassium: 3.1 mmol/L — ABNORMAL LOW (ref 3.5–5.1)
Sodium: 138 mmol/L (ref 136–145)

## 2014-10-31 LAB — CBC
HCT: 43.7 % (ref 40.0–52.0)
HGB: 14.8 g/dL (ref 13.0–18.0)
MCH: 29.3 pg (ref 26.0–34.0)
MCHC: 34 g/dL (ref 32.0–36.0)
MCV: 86 fL (ref 80–100)
PLATELETS: 221 10*3/uL (ref 150–440)
RBC: 5.06 10*6/uL (ref 4.40–5.90)
RDW: 12.8 % (ref 11.5–14.5)
WBC: 6.9 10*3/uL (ref 3.8–10.6)

## 2014-10-31 LAB — TROPONIN I: Troponin-I: <0.02 ng/mL

## 2015-01-16 ENCOUNTER — Emergency Department
Admit: 2015-01-16 | Disposition: A | Discharge status: Home / Self Care | Payer: Self-pay | Admitting: Emergency Medicine

## 2015-01-16 LAB — CBC
HCT: 44.3 % (ref 40.0–52.0)
HGB: 14.9 g/dL (ref 13.0–18.0)
MCH: 29 pg (ref 26.0–34.0)
MCHC: 33.5 g/dL (ref 32.0–36.0)
MCV: 86 fL (ref 80–100)
Platelet: 227 10*3/uL (ref 150–440)
RBC: 5.13 10*6/uL (ref 4.40–5.90)
RDW: 13 % (ref 11.5–14.5)
WBC: 7.5 10*3/uL (ref 3.8–10.6)

## 2015-01-16 LAB — COMPREHENSIVE METABOLIC PANEL
ALK PHOS: 101 U/L
ALT: 23 U/L
ANION GAP: 7 (ref 7–16)
Albumin: 4.3 g/dL
BILIRUBIN TOTAL: 0.5 mg/dL
BUN: 8 mg/dL
Calcium, Total: 9.3 mg/dL
Chloride: 106 mmol/L
Co2: 25 mmol/L
Creatinine: 0.87 mg/dL
EGFR (African American): >60
EGFR (Non-African Amer.): >60
Glucose: 106 mg/dL — ABNORMAL HIGH
POTASSIUM: 3.2 mmol/L — AB
SGOT(AST): 21 U/L
SODIUM: 138 mmol/L
TOTAL PROTEIN: 7.3 g/dL

## 2015-01-16 LAB — LIPASE, BLOOD: LIPASE: 62 U/L — AB

## 2015-01-19 ENCOUNTER — Emergency Department: Admit: 2015-01-19 | Disposition: A | Discharge status: Home / Self Care | Payer: Self-pay | Admitting: Student

## 2015-01-19 LAB — CBC
HCT: 45.3 % (ref 40.0–52.0)
HGB: 15.4 g/dL (ref 13.0–18.0)
MCH: 29.8 pg (ref 26.0–34.0)
MCHC: 34.1 g/dL (ref 32.0–36.0)
MCV: 87 fL (ref 80–100)
Platelet: 250 10*3/uL (ref 150–440)
RBC: 5.19 10*6/uL (ref 4.40–5.90)
RDW: 12.8 % (ref 11.5–14.5)
WBC: 7.5 10*3/uL (ref 3.8–10.6)

## 2015-01-20 LAB — COMPREHENSIVE METABOLIC PANEL
AST: 19 U/L
Albumin: 4.6 g/dL
Alkaline Phosphatase: 107 U/L
Anion Gap: 10 (ref 7–16)
BILIRUBIN TOTAL: 0.5 mg/dL
BUN: 6 mg/dL
CHLORIDE: 103 mmol/L
CREATININE: 1.04 mg/dL
Calcium, Total: 9.8 mg/dL
Co2: 27 mmol/L
EGFR (African American): >60
GLUCOSE: 124 mg/dL — AB
Potassium: 3.7 mmol/L
SGPT (ALT): 17 U/L
SODIUM: 140 mmol/L
Total Protein: 7.9 g/dL

## 2015-01-20 LAB — TROPONIN I: Troponin-I: <0.03 ng/mL
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
## 2015-02-08 NOTE — Discharge Summary (Signed)
PATIENT NAMHeywood Iles:  [name], Kevin [name] MR#:  [id number] DATE OF BIRTH:  03/11/1976  DATE OF ADMISSION:  09/05/2014 DATE OF DISCHARGE:  09/05/2014  PRIMARY CARE PHYSICIAN: [facility].    FINAL DIAGNOSES:  1.  Likely migraine with neurological symptoms versus transient ischemic attack.  2.  Relative hypotension bradycardia.  3.  Hyperlipidemia.  4.  Depression.   MEDICATIONS ON DISCHARGE: Include Tylenol #3 twice a day as needed for pain, Effexor 150 mg extended-release once a day, aspirin 81 mg daily, Maxalt 10 mg orally disintegrating tablet as needed for migraine, simvastatin 20 mg at bedtime.   DIET: Low sodium diet, regular consistency.   HOSPITAL COURSE: The patient was admitted 09/05/2014 and discharged 09/05/2014. Came in with suspected transient ischemic attack, had some stuttering of his speech, headache, some confusion. He has had extensive workups in the past at other hospital. He was admitted as an observation for further workup. He refused further testing, so I am unable to differentiate between stroke or migraine. The patient was bradycardic and slightly relative hypotension, so I stopped the patient's atenolol. Hospital course per problem list:  1.  Likely migraine with neurological symptoms versus transient ischemic attack. The patient had a headache going on for 15 hours, had some stuttering of speech, some confusion. The patient refused an MRI of the brain. His neurological symptoms had improved. The patient was stable for discharge. The patient wanted to follow up as outpatient. No further testing was done secondary to refusing further testing. The patient wants to stay on his aspirin only. He does not want to upgrade his stroke prevention. I did prescribe Maxalt as needed for migraine headache. 2.  Relative hypotension and bradycardia. The patient was given back his atenolol in the Emergency Room, blood pressure on the floor was 104/70, pulse 56.  3.  Hyperlipidemia, simvastatin  prescribed. He states that he takes it as an outpatient.  4.  Depression on Effexor.   LABORATORY AND RADIOLOGICAL DATA DURING THE HOSPITAL COURSE: Included a urine toxicology positive for amphetamines and benzodiazepines. Chest x-ray negative. CT scan of the head: No acute intracranial abnormalities, mild cerebral atrophy. Troponin negative. White blood cell count 6.9, hemoglobin and hematocrit 15.2 and 44.8, platelet count of 210,000, glucose 108, BUN 11, creatinine 1.13, sodium 138, potassium 4.1, chloride 104, alkaline phosphatase 123, ALT 23, AST 29.   TIME SPENT ON DISCHARGE: 35 minutes.    ____________________________ Herschell Dimesichard J. Renae GlossWieting, MD rjw:bm D: 09/05/2014 14:02:37 ET T: 09/06/2014 01:28:24 ET JOB#: [id number]  cc: Herschell Dimesichard J. Renae GlossWieting, MD, <Dictator> [facility]  Salley ScarletICHARD J Jesiel Garate MD ELECTRONICALLY SIGNED 09/06/2014 16:31

## 2015-05-08 ENCOUNTER — Emergency Department: Payer: Non-veteran care

## 2015-05-08 ENCOUNTER — Other Ambulatory Visit: Payer: Self-pay

## 2015-05-08 ENCOUNTER — Emergency Department
Admission: EM | Admit: 2015-05-08 | Discharge: 2015-05-09 | Disposition: A | Discharge status: Home / Self Care | Payer: Non-veteran care | Attending: Emergency Medicine | Admitting: Emergency Medicine

## 2015-05-08 ENCOUNTER — Encounter: Payer: Self-pay | Admitting: *Deleted

## 2015-05-08 DIAGNOSIS — R0602 Shortness of breath: Secondary | ICD-10-CM | POA: Insufficient documentation

## 2015-05-08 DIAGNOSIS — Z79899 Other long term (current) drug therapy: Secondary | ICD-10-CM | POA: Insufficient documentation

## 2015-05-08 DIAGNOSIS — Z720 Tobacco use: Secondary | ICD-10-CM | POA: Insufficient documentation

## 2015-05-08 DIAGNOSIS — R079 Chest pain, unspecified: Secondary | ICD-10-CM | POA: Insufficient documentation

## 2015-05-08 HISTORY — DX: Essential (primary) hypertension: I10

## 2015-05-08 HISTORY — DX: Atrophy of kidney (terminal): N26.1

## 2015-05-08 LAB — CBC WITH DIFFERENTIAL/PLATELET
BASOS PCT: 1 %
Basophils Absolute: 0.1 10*3/uL (ref 0–0.1)
EOS ABS: 0 10*3/uL (ref 0–0.7)
Eosinophils Relative: 0 %
HEMATOCRIT: 45.7 % (ref 40.0–52.0)
HEMOGLOBIN: 15.7 g/dL (ref 13.0–18.0)
LYMPHS ABS: 2.4 10*3/uL (ref 1.0–3.6)
LYMPHS PCT: 34 %
MCH: 29 pg (ref 26.0–34.0)
MCHC: 34.4 g/dL (ref 32.0–36.0)
MCV: 84.3 fL (ref 80.0–100.0)
MONO ABS: 0.5 10*3/uL (ref 0.2–1.0)
Monocytes Relative: 7 %
NEUTROS ABS: 4.1 10*3/uL (ref 1.4–6.5)
Neutrophils Relative %: 58 %
Platelets: 266 10*3/uL (ref 150–440)
RBC: 5.41 MIL/uL (ref 4.40–5.90)
RDW: 13.2 % (ref 11.5–14.5)
WBC: 7 10*3/uL (ref 3.8–10.6)

## 2015-05-08 LAB — BASIC METABOLIC PANEL
ANION GAP: 10 (ref 5–15)
BUN: 6 mg/dL (ref 6–20)
CO2: 28 mmol/L (ref 22–32)
Calcium: 9.3 mg/dL (ref 8.9–10.3)
Chloride: 102 mmol/L (ref 101–111)
Creatinine, Ser: 0.98 mg/dL (ref 0.61–1.24)
GFR calc Af Amer: >60 mL/min (ref >60)
Glucose, Bld: 125 mg/dL — ABNORMAL HIGH (ref 65–99)
Potassium: 3.3 mmol/L — ABNORMAL LOW (ref 3.5–5.1)
Sodium: 140 mmol/L (ref 135–145)

## 2015-05-08 LAB — TROPONIN I: Troponin I: <0.03 ng/mL (ref <0.031)

## 2015-05-08 MED ORDER — SODIUM CHLORIDE 0.9 % IV BOLUS (SEPSIS)
1000.0000 mL | Freq: Once | INTRAVENOUS | Status: AC
Start: 2015-05-08 — End: 2015-05-09
  Administered 2015-05-08: 1000 mL via INTRAVENOUS

## 2015-05-08 MED ORDER — POTASSIUM CHLORIDE CRYS ER 20 MEQ PO TBCR
40.0000 meq | EXTENDED_RELEASE_TABLET | Freq: Once | ORAL | Status: AC
  Administered 2015-05-08: 40 meq via ORAL
  Filled 2015-05-08: qty 2

## 2015-05-08 MED ORDER — IOHEXOL 350 MG/ML SOLN
100.0000 mL | Freq: Once | INTRAVENOUS | Status: AC | PRN
  Administered 2015-05-08: 100 mL via INTRAVENOUS

## 2015-05-08 NOTE — ED Notes (Signed)
Patient transported to CT 

## 2015-05-08 NOTE — ED Notes (Signed)
Per patient's report, he woke up today feeling anxious. Patient states he progressively felt weak and short of breath today and also diaphoretic. Patient states he has had serial ekg's this month due to fatigue and the last showed a change in ekg  Which showed an infarct.
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
## 2015-05-09 LAB — TROPONIN I: Troponin I: <0.03 ng/mL (ref <0.031)

## 2015-05-09 LAB — CK: Total CK: 47 U/L — ABNORMAL LOW (ref 49–397)

## 2015-05-09 MED ORDER — LORAZEPAM 1 MG PO TABS
1.0000 mg | ORAL_TABLET | Freq: Once | ORAL | Status: AC
  Administered 2015-05-09: 1 mg via ORAL
  Filled 2015-05-09: qty 1

## 2015-05-09 NOTE — ED Notes (Signed)
MD at bedside. 

## 2015-05-09 NOTE — ED Notes (Signed)
Discharger instruction reviewed, all questions answered. Follow up care reviewed.

## 2015-05-09 NOTE — ED Provider Notes (Signed)
-----------------------------------------   1:32 AM on 05/09/2015 -----------------------------------------  CT angiogram chest interpreted per Dr. Cherly Hensen: 1. No evidence of pulmonary embolus. 2. Minimal bibasilar atelectasis noted; lungs otherwise clear. 3. Mild splenomegaly noted.  Updated patient of CT scan. Repeat troponin has been drawn and is processing currently. Patient voicing no complaints other than he missed his nighttime dose of benzodiazepine and he feels his blood pressure is elevated due to anxiety. Will order 1 mg Ativan PO.  ----------------------------------------- 2:33 AM on 05/09/2015 -----------------------------------------  Updated patient of repeat negative troponin and CK results. Patient voices no complaints currently. Will follow up with cardiologist. Blood pressure and pulse normalized at the time of discharge. Strict return precautions given. Patient verbalizes understanding and agree with plan of care.  Irean Hong, MD 05/09/15 [phone]

## 2015-05-09 NOTE — Discharge Instructions (Signed)
1. Continue all medications as directed by your doctor. 2. Return to the ER for worsening symptoms, persistent vomiting, difficulty breathing or other concerns.  Chest Pain (Nonspecific) It is often hard to give a specific diagnosis for the cause of chest pain. There is always a chance that your pain could be related to something serious, such as a heart attack or a blood clot in the lungs. You need to follow up with your health care provider for further evaluation. CAUSES   Heartburn.  Pneumonia or bronchitis.  Anxiety or stress.  Inflammation around your heart (pericarditis) or lung (pleuritis or pleurisy).  A blood clot in the lung.  A collapsed lung (pneumothorax). It can develop suddenly on its own (spontaneous pneumothorax) or from trauma to the chest.  Shingles infection (herpes zoster virus). The chest wall is composed of bones, muscles, and cartilage. Any of these can be the source of the pain.  The bones can be bruised by injury.  The muscles or cartilage can be strained by coughing or overwork.  The cartilage can be affected by inflammation and become sore (costochondritis). DIAGNOSIS  Lab tests or other studies may be needed to find the cause of your pain. Your health care provider may have you take a test called an ambulatory electrocardiogram (ECG). An ECG records your heartbeat patterns over a 24-hour period. You may also have other tests, such as:  Transthoracic echocardiogram (TTE). During echocardiography, sound waves are used to evaluate how blood flows through your heart.  Transesophageal echocardiogram (TEE).  Cardiac monitoring. This allows your health care provider to monitor your heart rate and rhythm in real time.  Holter monitor. This is a portable device that records your heartbeat and can help diagnose heart arrhythmias. It allows your health care provider to track your heart activity for several days, if needed.  Stress tests by exercise or by giving  medicine that makes the heart beat faster. TREATMENT   Treatment depends on what may be causing your chest pain. Treatment may include:  Acid blockers for heartburn.  Anti-inflammatory medicine.  Pain medicine for inflammatory conditions.  Antibiotics if an infection is present.  You may be advised to change lifestyle habits. This includes stopping smoking and avoiding alcohol, caffeine, and chocolate.  You may be advised to keep your head raised (elevated) when sleeping. This reduces the chance of acid going backward from your stomach into your esophagus. Most of the time, nonspecific chest pain will improve within 2-3 days with rest and mild pain medicine.  HOME CARE INSTRUCTIONS   If antibiotics were prescribed, take them as directed. Finish them even if you start to feel better.  For the next few days, avoid physical activities that bring on chest pain. Continue physical activities as directed.  Do not use any tobacco products, including cigarettes, chewing tobacco, or electronic cigarettes.  Avoid drinking alcohol.  Only take medicine as directed by your health care provider.  Follow your health care provider's suggestions for further testing if your chest pain does not go away.  Keep any follow-up appointments you made. If you do not go to an appointment, you could develop lasting (chronic) problems with pain. If there is any problem keeping an appointment, call to reschedule. SEEK MEDICAL CARE IF:   Your chest pain does not go away, even after treatment.  You have a rash with blisters on your chest.  You have a fever. SEEK IMMEDIATE MEDICAL CARE IF:   You have increased chest pain or pain  that spreads to your arm, neck, jaw, back, or abdomen.  You have shortness of breath.  You have an increasing cough, or you cough up blood.  You have severe back or abdominal pain.  You feel nauseous or vomit.  You have severe weakness.  You faint.  You have  chills. This is an emergency. Do not wait to see if the pain will go away. Get medical help at once. Call your local emergency services (911 in [place] Do not drive yourself to the hospital. MAKE SURE YOU:   Understand these instructions.  Will watch your condition.  Will get help right away if you are not doing well or get worse. Document Released: 07/14/2005 Document Revised: 10/09/2013 Document Reviewed: 05/09/2008 [facility] Patient Information 2015 [place], [place]. This information is not intended to replace advice given to you by your health care provider. Make sure you discuss any questions you have with your health care provider.  Shortness of Breath Shortness of breath means you have trouble breathing. It could also mean that you have a medical problem. You should get immediate medical care for shortness of breath. CAUSES   Not enough oxygen in the air such as with high altitudes or a smoke-filled room.  Certain lung diseases, infections, or problems.  Heart disease or conditions, such as angina or heart failure.  Low red blood cells (anemia).  Poor physical fitness, which can cause shortness of breath when you exercise.  Chest or back injuries or stiffness.  Being overweight.  Smoking.  Anxiety, which can make you feel like you are not getting enough air. DIAGNOSIS  Serious medical problems can often be found during your physical exam. Tests may also be done to determine why you are having shortness of breath. Tests may include:  Chest X-rays.  Lung function tests.  Blood tests.  An electrocardiogram (ECG).  An ambulatory electrocardiogram. An ambulatory ECG records your heartbeat patterns over a 24-hour period.  Exercise testing.  A transthoracic echocardiogram (TTE). During echocardiography, sound waves are used to evaluate how blood flows through your heart.  A transesophageal echocardiogram (TEE).  Imaging scans. Your health care provider may not be  able to find a cause for your shortness of breath after your exam. In this case, it is important to have a follow-up exam with your health care provider as directed.  TREATMENT  Treatment for shortness of breath depends on the cause of your symptoms and can vary greatly. HOME CARE INSTRUCTIONS   Do not smoke. Smoking is a common cause of shortness of breath. If you smoke, ask for help to quit.  Avoid being around chemicals or things that may bother your breathing, such as paint fumes and dust.  Rest as needed. Slowly resume your usual activities.  If medicines were prescribed, take them as directed for the full length of time directed. This includes oxygen and any inhaled medicines.  Keep all follow-up appointments as directed by your health care provider. SEEK MEDICAL CARE IF:   Your condition does not improve in the time expected.  You have a hard time doing your normal activities even with rest.  You have any new symptoms. SEEK IMMEDIATE MEDICAL CARE IF:   Your shortness of breath gets worse.  You feel light-headed, faint, or develop a cough not controlled with medicines.  You start coughing up blood.  You have pain with breathing.  You have chest pain or pain in your arms, shoulders, or abdomen.  You have a fever.  You  are unable to walk up stairs or exercise the way you normally do. MAKE SURE YOU:  Understand these instructions.  Will watch your condition.  Will get help right away if you are not doing well or get worse. Document Released: 06/29/2001 Document Revised: 10/09/2013 Document Reviewed: 12/20/2011 [facility] Patient Information 2015 [place], [place]. This information is not intended to replace advice given to you by your health care provider. Make sure you discuss any questions you have with your health care provider.

## 2015-05-09 NOTE — ED Notes (Signed)
Copy of 12 lead EKG given to pt by MD. Pt to f/u with [facility] cardiologist.

## 2015-05-09 NOTE — ED Notes (Signed)
Patient is resting comfortably. 

## 2015-05-09 NOTE — ED Notes (Signed)
Lab called to add CK onto Trop sent down earlier.

## 2015-05-10 ENCOUNTER — Emergency Department (HOSPITAL_COMMUNITY)
Admission: EM | Admit: 2015-05-10 | Discharge: 2015-05-10 | Discharge status: Against Medical Advice | Attending: Emergency Medicine | Admitting: Emergency Medicine

## 2015-05-10 ENCOUNTER — Encounter (HOSPITAL_COMMUNITY): Payer: Self-pay | Admitting: Nurse Practitioner

## 2015-05-10 DIAGNOSIS — I10 Essential (primary) hypertension: Secondary | ICD-10-CM | POA: Insufficient documentation

## 2015-05-10 DIAGNOSIS — R61 Generalized hyperhidrosis: Secondary | ICD-10-CM | POA: Insufficient documentation

## 2015-05-10 DIAGNOSIS — R51 Headache: Secondary | ICD-10-CM | POA: Insufficient documentation

## 2015-05-10 DIAGNOSIS — R6883 Chills (without fever): Secondary | ICD-10-CM | POA: Diagnosis not present

## 2015-05-10 DIAGNOSIS — Z720 Tobacco use: Secondary | ICD-10-CM | POA: Diagnosis not present

## 2015-05-10 DIAGNOSIS — R079 Chest pain, unspecified: Secondary | ICD-10-CM | POA: Diagnosis not present

## 2015-05-10 DIAGNOSIS — R110 Nausea: Secondary | ICD-10-CM | POA: Diagnosis not present

## 2015-05-10 DIAGNOSIS — I2510 Atherosclerotic heart disease of native coronary artery without angina pectoris: Secondary | ICD-10-CM | POA: Diagnosis not present

## 2015-05-10 DIAGNOSIS — R6884 Jaw pain: Secondary | ICD-10-CM | POA: Diagnosis not present

## 2015-05-10 LAB — CBC
HCT: 45 % (ref 39.0–52.0)
HEMOGLOBIN: 15.7 g/dL (ref 13.0–17.0)
MCH: 30.1 pg (ref 26.0–34.0)
MCHC: 34.9 g/dL (ref 30.0–36.0)
MCV: 86.2 fL (ref 78.0–100.0)
Platelets: 282 10*3/uL (ref 150–400)
RBC: 5.22 MIL/uL (ref 4.22–5.81)
RDW: 12.7 % (ref 11.5–15.5)
WBC: 7.1 10*3/uL (ref 4.0–10.5)

## 2015-05-10 LAB — BASIC METABOLIC PANEL
Anion gap: 13 (ref 5–15)
BUN: 7 mg/dL (ref 6–20)
CO2: 22 mmol/L (ref 22–32)
Calcium: 9.4 mg/dL (ref 8.9–10.3)
Chloride: 103 mmol/L (ref 101–111)
Creatinine, Ser: 0.98 mg/dL (ref 0.61–1.24)
GFR calc Af Amer: >60 mL/min (ref >60)
GLUCOSE: 106 mg/dL — AB (ref 65–99)
POTASSIUM: 3.7 mmol/L (ref 3.5–5.1)
SODIUM: 138 mmol/L (ref 135–145)

## 2015-05-10 LAB — I-STAT TROPONIN, ED: Troponin i, poc: 0 ng/mL (ref 0.00–0.08)

## 2015-05-10 NOTE — ED Notes (Addendum)
Pt c/o intermittent chest and jaw pressure, headache, chills, diaphoresis, nausea today. He reports recent cardiac workup at [facility] but was d/ced to f/u with cardiology. He tried xanax for his symptoms today with no relief. A&Ox4, resp e/u

## 2015-05-18 ENCOUNTER — Other Ambulatory Visit: Payer: Self-pay

## 2015-05-18 ENCOUNTER — Emergency Department

## 2015-05-18 ENCOUNTER — Encounter: Payer: Self-pay | Admitting: Emergency Medicine

## 2015-05-18 ENCOUNTER — Inpatient Hospital Stay
Admission: EM | Admit: 2015-05-18 | Discharge: 2015-05-18 | DRG: 065 | Discharge status: Against Medical Advice | Attending: Internal Medicine | Admitting: Internal Medicine

## 2015-05-18 DIAGNOSIS — Z8249 Family history of ischemic heart disease and other diseases of the circulatory system: Secondary | ICD-10-CM

## 2015-05-18 DIAGNOSIS — Z8673 Personal history of transient ischemic attack (TIA), and cerebral infarction without residual deficits: Secondary | ICD-10-CM

## 2015-05-18 DIAGNOSIS — I214 Non-ST elevation (NSTEMI) myocardial infarction: Secondary | ICD-10-CM | POA: Diagnosis present

## 2015-05-18 DIAGNOSIS — F419 Anxiety disorder, unspecified: Secondary | ICD-10-CM | POA: Diagnosis present

## 2015-05-18 DIAGNOSIS — I10 Essential (primary) hypertension: Secondary | ICD-10-CM | POA: Diagnosis present

## 2015-05-18 DIAGNOSIS — F17210 Nicotine dependence, cigarettes, uncomplicated: Secondary | ICD-10-CM | POA: Diagnosis present

## 2015-05-18 DIAGNOSIS — E785 Hyperlipidemia, unspecified: Secondary | ICD-10-CM | POA: Diagnosis present

## 2015-05-18 DIAGNOSIS — G8194 Hemiplegia, unspecified affecting left nondominant side: Secondary | ICD-10-CM | POA: Diagnosis present

## 2015-05-18 DIAGNOSIS — K219 Gastro-esophageal reflux disease without esophagitis: Secondary | ICD-10-CM | POA: Diagnosis present

## 2015-05-18 DIAGNOSIS — I2510 Atherosclerotic heart disease of native coronary artery without angina pectoris: Secondary | ICD-10-CM | POA: Diagnosis present

## 2015-05-18 DIAGNOSIS — I639 Cerebral infarction, unspecified: Secondary | ICD-10-CM | POA: Diagnosis present

## 2015-05-18 HISTORY — DX: Gastro-esophageal reflux disease without esophagitis: K21.9

## 2015-05-18 LAB — CBC
HCT: 45.6 % (ref 40.0–52.0)
Hemoglobin: 15.5 g/dL (ref 13.0–18.0)
MCH: 29 pg (ref 26.0–34.0)
MCHC: 34 g/dL (ref 32.0–36.0)
MCV: 85.2 fL (ref 80.0–100.0)
PLATELETS: 252 10*3/uL (ref 150–440)
RBC: 5.35 MIL/uL (ref 4.40–5.90)
RDW: 13.5 % (ref 11.5–14.5)
WBC: 8.1 10*3/uL (ref 3.8–10.6)

## 2015-05-18 LAB — COMPREHENSIVE METABOLIC PANEL
ALT: 29 U/L (ref 17–63)
ANION GAP: 8 (ref 5–15)
AST: 27 U/L (ref 15–41)
Albumin: 4.3 g/dL (ref 3.5–5.0)
Alkaline Phosphatase: 114 U/L (ref 38–126)
BUN: 11 mg/dL (ref 6–20)
CALCIUM: 9.1 mg/dL (ref 8.9–10.3)
CO2: 26 mmol/L (ref 22–32)
Chloride: 106 mmol/L (ref 101–111)
Creatinine, Ser: 1.06 mg/dL (ref 0.61–1.24)
GFR calc Af Amer: >60 mL/min (ref >60)
GFR calc non Af Amer: >60 mL/min (ref >60)
GLUCOSE: 118 mg/dL — AB (ref 65–99)
Potassium: 3.5 mmol/L (ref 3.5–5.1)
Sodium: 140 mmol/L (ref 135–145)
Total Bilirubin: 0.4 mg/dL (ref 0.3–1.2)
Total Protein: 7.3 g/dL (ref 6.5–8.1)

## 2015-05-18 LAB — GLUCOSE, CAPILLARY: GLUCOSE-CAPILLARY: 133 mg/dL — AB (ref 65–99)

## 2015-05-18 LAB — DIFFERENTIAL
BASOS ABS: 0.1 10*3/uL (ref 0–0.1)
BASOS PCT: 1 %
Eosinophils Absolute: 0 10*3/uL (ref 0–0.7)
Eosinophils Relative: 0 %
LYMPHS PCT: 31 %
Lymphs Abs: 2.5 10*3/uL (ref 1.0–3.6)
MONO ABS: 0.6 10*3/uL (ref 0.2–1.0)
Monocytes Relative: 7 %
NEUTROS PCT: 61 %
Neutro Abs: 4.9 10*3/uL (ref 1.4–6.5)

## 2015-05-18 LAB — PROTIME-INR
INR: 0.89
Prothrombin Time: 12.3 seconds (ref 11.4–15.0)

## 2015-05-18 LAB — APTT: APTT: 32 s (ref 24–36)

## 2015-05-18 LAB — TROPONIN I: Troponin I: <0.03 ng/mL (ref <0.031)

## 2015-05-18 MED ORDER — ASPIRIN 81 MG PO CHEW
324.0000 mg | CHEWABLE_TABLET | Freq: Once | ORAL | Status: AC
  Administered 2015-05-18: 324 mg via ORAL
  Filled 2015-05-18: qty 4

## 2015-05-18 MED ORDER — LORAZEPAM 2 MG/ML IJ SOLN
1.0000 mg | Freq: Once | INTRAMUSCULAR | Status: AC
Start: 2015-05-18 — End: 2015-05-18
  Administered 2015-05-18: 1 mg via INTRAVENOUS
  Filled 2015-05-18: qty 1

## 2015-05-18 NOTE — Care Management Note (Signed)
Case Management Note  Patient Details  Name: Kevin [name] MRN: [id number] Date of Birth: 23-Apr-1976  Subjective/Objective:  Per patient and spouse [employer] is primary coverage veterans is secondary. [place] in patient access also verified information.                  Action/Plan:   Expected Discharge Date:                  Expected Discharge Plan:     In-House Referral:     Discharge planning Services     Post Acute Care Choice:    Choice offered to:     DME Arranged:    DME Agency:     HH Arranged:    HH Agency:     Status of Service:     Medicare Important Message Given:    Date Medicare IM Given:    Medicare IM give by:    Date Additional Medicare IM Given:    Additional Medicare Important Message give by:     If discussed at Long Length of Stay Meetings, dates discussed:    Additional Comments:  Caren Macadam, RN 05/18/2015, 4:54 PM
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
## 2015-05-18 NOTE — ED Notes (Signed)
Called [facility] regarding scanner that does not work in room 11
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
## 2015-05-18 NOTE — ED Notes (Signed)
Pt refused to stay and be moved to inpatient. He states that he is tired of waiting and that he was told by admitting doc over 2 hours ago that he would be in a room within a half hour, and he is tired of waiting. Pt also requested anxiety medication and this nurse has not heard back from admitting doc to request order.

## 2015-05-18 NOTE — ED Notes (Signed)
SOC in progress.  

## 2015-05-18 NOTE — ED Notes (Signed)
States he developed left sided weakness and facial droop at 1246 today   Hx of cva

## 2015-05-18 NOTE — ED Notes (Signed)
1c to call back for report

## 2015-07-18 ENCOUNTER — Encounter: Payer: Self-pay | Admitting: Emergency Medicine

## 2015-07-18 ENCOUNTER — Emergency Department

## 2015-07-18 ENCOUNTER — Emergency Department
Admission: EM | Admit: 2015-07-18 | Discharge: 2015-07-18 | Disposition: A | Discharge status: Home / Self Care | Attending: Student | Admitting: Student

## 2015-07-18 DIAGNOSIS — Y990 Civilian activity done for income or pay: Secondary | ICD-10-CM | POA: Diagnosis not present

## 2015-07-18 DIAGNOSIS — Z79899 Other long term (current) drug therapy: Secondary | ICD-10-CM | POA: Diagnosis not present

## 2015-07-18 DIAGNOSIS — Z720 Tobacco use: Secondary | ICD-10-CM | POA: Insufficient documentation

## 2015-07-18 DIAGNOSIS — T148XXA Other injury of unspecified body region, initial encounter: Secondary | ICD-10-CM

## 2015-07-18 DIAGNOSIS — W1789XA Other fall from one level to another, initial encounter: Secondary | ICD-10-CM | POA: Diagnosis not present

## 2015-07-18 DIAGNOSIS — S301XXA Contusion of abdominal wall, initial encounter: Secondary | ICD-10-CM | POA: Insufficient documentation

## 2015-07-18 DIAGNOSIS — S3991XA Unspecified injury of abdomen, initial encounter: Secondary | ICD-10-CM | POA: Diagnosis present

## 2015-07-18 DIAGNOSIS — Y9289 Other specified places as the place of occurrence of the external cause: Secondary | ICD-10-CM | POA: Insufficient documentation

## 2015-07-18 DIAGNOSIS — I10 Essential (primary) hypertension: Secondary | ICD-10-CM | POA: Insufficient documentation

## 2015-07-18 DIAGNOSIS — Y9389 Activity, other specified: Secondary | ICD-10-CM | POA: Diagnosis not present

## 2015-07-18 LAB — CBC WITH DIFFERENTIAL/PLATELET
BASOS ABS: 0.1 10*3/uL (ref 0–0.1)
BASOS PCT: 1 %
EOS ABS: 0 10*3/uL (ref 0–0.7)
Eosinophils Relative: 0 %
HCT: 42.9 % (ref 40.0–52.0)
Hemoglobin: 14.6 g/dL (ref 13.0–18.0)
Lymphocytes Relative: 36 %
Lymphs Abs: 2.1 10*3/uL (ref 1.0–3.6)
MCH: 29.1 pg (ref 26.0–34.0)
MCHC: 34 g/dL (ref 32.0–36.0)
MCV: 85.4 fL (ref 80.0–100.0)
MONOS PCT: 8 %
Monocytes Absolute: 0.4 10*3/uL (ref 0.2–1.0)
NEUTROS PCT: 55 %
Neutro Abs: 3.1 10*3/uL (ref 1.4–6.5)
Platelets: 212 10*3/uL (ref 150–440)
RBC: 5.02 MIL/uL (ref 4.40–5.90)
RDW: 13.1 % (ref 11.5–14.5)
WBC: 5.7 10*3/uL (ref 3.8–10.6)

## 2015-07-18 LAB — COMPREHENSIVE METABOLIC PANEL
ALBUMIN: 4.1 g/dL (ref 3.5–5.0)
ALT: 23 U/L (ref 17–63)
ANION GAP: 6 (ref 5–15)
AST: 22 U/L (ref 15–41)
Alkaline Phosphatase: 83 U/L (ref 38–126)
BUN: 13 mg/dL (ref 6–20)
CHLORIDE: 104 mmol/L (ref 101–111)
CO2: 27 mmol/L (ref 22–32)
Calcium: 8.9 mg/dL (ref 8.9–10.3)
Creatinine, Ser: 1.06 mg/dL (ref 0.61–1.24)
GFR calc Af Amer: >60 mL/min (ref >60)
GFR calc non Af Amer: >60 mL/min (ref >60)
GLUCOSE: 89 mg/dL (ref 65–99)
POTASSIUM: 3.7 mmol/L (ref 3.5–5.1)
SODIUM: 137 mmol/L (ref 135–145)
Total Bilirubin: 0.5 mg/dL (ref 0.3–1.2)
Total Protein: 6.8 g/dL (ref 6.5–8.1)

## 2015-07-18 LAB — URINALYSIS COMPLETE WITH MICROSCOPIC (ARMC ONLY)
BACTERIA UA: NONE SEEN
Bilirubin Urine: NEGATIVE
GLUCOSE, UA: NEGATIVE mg/dL
HGB URINE DIPSTICK: NEGATIVE
Ketones, ur: NEGATIVE mg/dL
Leukocytes, UA: NEGATIVE
Nitrite: NEGATIVE
PROTEIN: NEGATIVE mg/dL
RBC / HPF: NONE SEEN RBC/hpf (ref 0–5)
SPECIFIC GRAVITY, URINE: 1.018 (ref 1.005–1.030)
pH: 7 (ref 5.0–8.0)

## 2015-07-18 LAB — LIPASE, BLOOD: Lipase: 49 U/L (ref 22–51)

## 2015-07-18 LAB — PROTIME-INR
INR: 0.89
PROTHROMBIN TIME: 12.3 s (ref 11.4–15.0)

## 2015-07-18 LAB — APTT: aPTT: 34 seconds (ref 24–36)

## 2015-07-18 MED ORDER — IOHEXOL 300 MG/ML  SOLN
100.0000 mL | Freq: Once | INTRAMUSCULAR | Status: AC | PRN
  Administered 2015-07-18: 100 mL via INTRAVENOUS

## 2015-07-18 MED ORDER — ONDANSETRON HCL 4 MG/2ML IJ SOLN
4.0000 mg | Freq: Once | INTRAMUSCULAR | Status: AC
  Administered 2015-07-18: 4 mg via INTRAVENOUS
  Filled 2015-07-18: qty 2

## 2015-07-18 MED ORDER — SODIUM CHLORIDE 0.9 % IV BOLUS (SEPSIS)
1000.0000 mL | Freq: Once | INTRAVENOUS | Status: AC
  Administered 2015-07-18: 1000 mL via INTRAVENOUS

## 2015-07-18 MED ORDER — MORPHINE SULFATE (PF) 4 MG/ML IV SOLN
4.0000 mg | Freq: Once | INTRAVENOUS | Status: AC
  Administered 2015-07-18: 4 mg via INTRAVENOUS
  Filled 2015-07-18: qty 1
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
## 2015-07-18 NOTE — ED Notes (Signed)
Pt is tender on palpation to right side of abd . Was sent over from [facility]

## 2015-07-18 NOTE — ED Notes (Signed)
Pt discharged home after verbalizing understanding of discharge instructions; nad noted. 
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
## 2015-09-15 IMAGING — CR DG ABDOMEN 1V
1 series · 1 of 1 positions shown · non-contrast
Comparison: None.

CLINICAL DATA: Chest pain and shortness of breath.  Blood in urine.

EXAM:
ABDOMEN - 1 VIEW

[t abdomen supine]
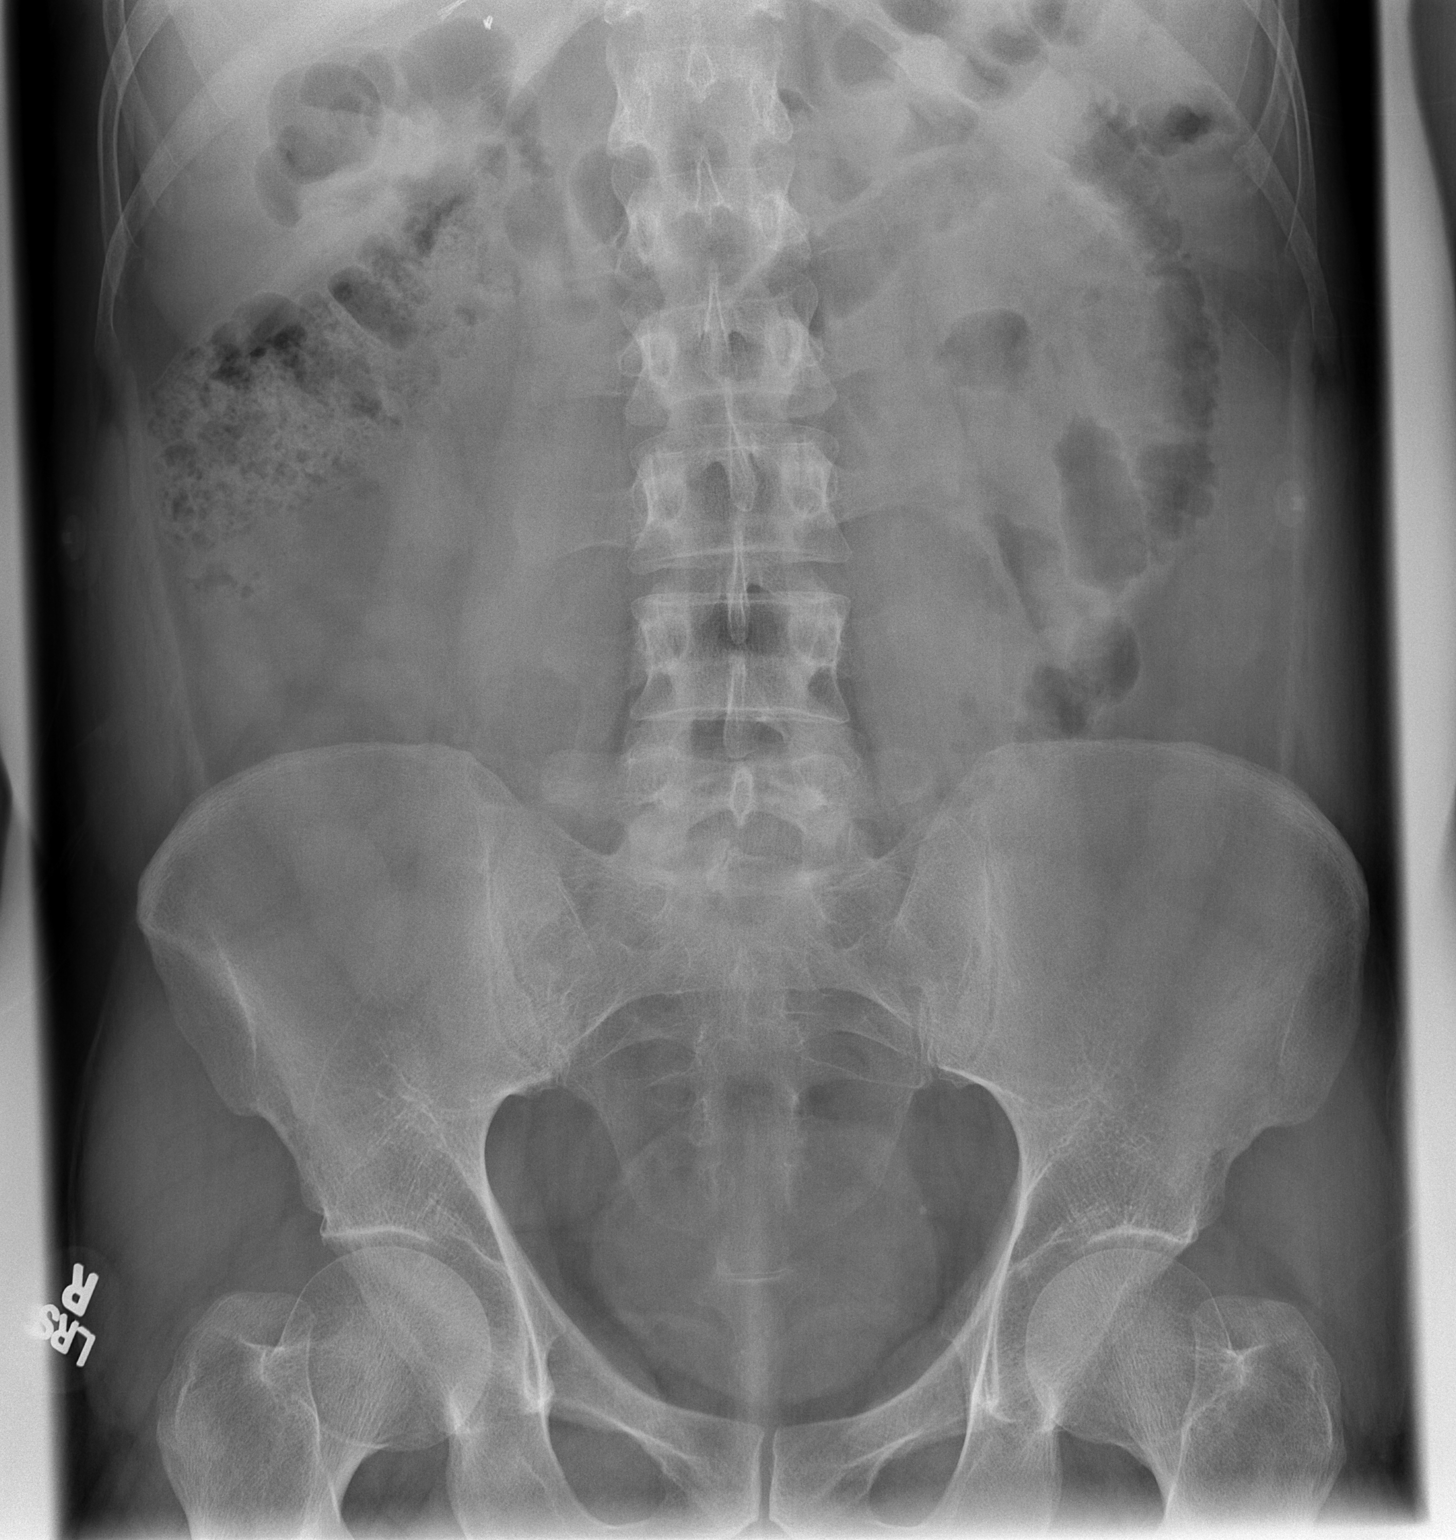

[1 of 1 positions shown; findings below may reference images not displayed]

FINDINGS: The bowel gas pattern is normal. No radio-opaque calculi or other
significant radiographic abnormality are seen.
IMPRESSION: Negative.

## 2015-10-06 ENCOUNTER — Emergency Department (HOSPITAL_COMMUNITY): Payer: Non-veteran care

## 2015-10-06 ENCOUNTER — Encounter (HOSPITAL_COMMUNITY): Payer: Self-pay

## 2015-10-06 ENCOUNTER — Emergency Department (HOSPITAL_COMMUNITY)
Admission: EM | Admit: 2015-10-06 | Discharge: 2015-10-06 | Disposition: A | Discharge status: Home / Self Care | Payer: Non-veteran care | Attending: Emergency Medicine | Admitting: Emergency Medicine

## 2015-10-06 DIAGNOSIS — Z79891 Long term (current) use of opiate analgesic: Secondary | ICD-10-CM | POA: Insufficient documentation

## 2015-10-06 DIAGNOSIS — I2510 Atherosclerotic heart disease of native coronary artery without angina pectoris: Secondary | ICD-10-CM | POA: Insufficient documentation

## 2015-10-06 DIAGNOSIS — I10 Essential (primary) hypertension: Secondary | ICD-10-CM | POA: Insufficient documentation

## 2015-10-06 DIAGNOSIS — Z7952 Long term (current) use of systemic steroids: Secondary | ICD-10-CM | POA: Diagnosis not present

## 2015-10-06 DIAGNOSIS — R000 Tachycardia, unspecified: Secondary | ICD-10-CM | POA: Diagnosis not present

## 2015-10-06 DIAGNOSIS — Z79899 Other long term (current) drug therapy: Secondary | ICD-10-CM | POA: Diagnosis not present

## 2015-10-06 DIAGNOSIS — Z87828 Personal history of other (healed) physical injury and trauma: Secondary | ICD-10-CM | POA: Diagnosis not present

## 2015-10-06 DIAGNOSIS — Z8719 Personal history of other diseases of the digestive system: Secondary | ICD-10-CM | POA: Insufficient documentation

## 2015-10-06 DIAGNOSIS — Z8619 Personal history of other infectious and parasitic diseases: Secondary | ICD-10-CM | POA: Diagnosis not present

## 2015-10-06 DIAGNOSIS — Z8673 Personal history of transient ischemic attack (TIA), and cerebral infarction without residual deficits: Secondary | ICD-10-CM | POA: Diagnosis not present

## 2015-10-06 DIAGNOSIS — Z87448 Personal history of other diseases of urinary system: Secondary | ICD-10-CM | POA: Insufficient documentation

## 2015-10-06 DIAGNOSIS — R0602 Shortness of breath: Secondary | ICD-10-CM | POA: Insufficient documentation

## 2015-10-06 DIAGNOSIS — K859 Acute pancreatitis without necrosis or infection, unspecified: Secondary | ICD-10-CM | POA: Diagnosis not present

## 2015-10-06 DIAGNOSIS — K861 Other chronic pancreatitis: Secondary | ICD-10-CM

## 2015-10-06 DIAGNOSIS — F419 Anxiety disorder, unspecified: Secondary | ICD-10-CM | POA: Insufficient documentation

## 2015-10-06 DIAGNOSIS — R1010 Upper abdominal pain, unspecified: Secondary | ICD-10-CM | POA: Diagnosis present

## 2015-10-06 DIAGNOSIS — Z862 Personal history of diseases of the blood and blood-forming organs and certain disorders involving the immune mechanism: Secondary | ICD-10-CM | POA: Diagnosis not present

## 2015-10-06 LAB — COMPREHENSIVE METABOLIC PANEL
ALK PHOS: 81 U/L (ref 38–126)
ALT: 34 U/L (ref 17–63)
ANION GAP: 11 (ref 5–15)
AST: 22 U/L (ref 15–41)
Albumin: 3.8 g/dL (ref 3.5–5.0)
BILIRUBIN TOTAL: 0.5 mg/dL (ref 0.3–1.2)
BUN: 10 mg/dL (ref 6–20)
CALCIUM: 9.3 mg/dL (ref 8.9–10.3)
CO2: 24 mmol/L (ref 22–32)
Chloride: 102 mmol/L (ref 101–111)
Creatinine, Ser: 1.12 mg/dL (ref 0.61–1.24)
GLUCOSE: 98 mg/dL (ref 65–99)
Potassium: 3.4 mmol/L — ABNORMAL LOW (ref 3.5–5.1)
Sodium: 137 mmol/L (ref 135–145)
TOTAL PROTEIN: 6.4 g/dL — AB (ref 6.5–8.1)

## 2015-10-06 LAB — CBC WITH DIFFERENTIAL/PLATELET
Basophils Absolute: 0 10*3/uL (ref 0.0–0.1)
Basophils Relative: 0 %
Eosinophils Absolute: 0 10*3/uL (ref 0.0–0.7)
Eosinophils Relative: 0 %
HEMATOCRIT: 46.5 % (ref 39.0–52.0)
Hemoglobin: 15.8 g/dL (ref 13.0–17.0)
LYMPHS ABS: 3.6 10*3/uL (ref 0.7–4.0)
Lymphocytes Relative: 41 %
MCH: 29.5 pg (ref 26.0–34.0)
MCHC: 34 g/dL (ref 30.0–36.0)
MCV: 86.8 fL (ref 78.0–100.0)
MONOS PCT: 7 %
Monocytes Absolute: 0.6 10*3/uL (ref 0.1–1.0)
NEUTROS ABS: 4.5 10*3/uL (ref 1.7–7.7)
NEUTROS PCT: 52 %
Platelets: 227 10*3/uL (ref 150–400)
RBC: 5.36 MIL/uL (ref 4.22–5.81)
RDW: 12.9 % (ref 11.5–15.5)
WBC: 8.8 10*3/uL (ref 4.0–10.5)

## 2015-10-06 LAB — URINALYSIS, ROUTINE W REFLEX MICROSCOPIC
BILIRUBIN URINE: NEGATIVE
Glucose, UA: NEGATIVE mg/dL
Hgb urine dipstick: NEGATIVE
KETONES UR: NEGATIVE mg/dL
Leukocytes, UA: NEGATIVE
NITRITE: NEGATIVE
PH: 6 (ref 5.0–8.0)
Protein, ur: NEGATIVE mg/dL
Specific Gravity, Urine: <1.005 — ABNORMAL LOW (ref 1.005–1.030)

## 2015-10-06 LAB — LIPASE, BLOOD: Lipase: 61 U/L — ABNORMAL HIGH (ref 11–51)

## 2015-10-06 LAB — TROPONIN I: Troponin I: <0.03 ng/mL (ref <0.031)

## 2015-10-06 MED ORDER — SODIUM CHLORIDE 0.9 % IV BOLUS (SEPSIS)
1000.0000 mL | Freq: Once | INTRAVENOUS | Status: AC
  Administered 2015-10-06: 1000 mL via INTRAVENOUS

## 2015-10-06 MED ORDER — HYDROMORPHONE HCL 1 MG/ML IJ SOLN
1.0000 mg | Freq: Once | INTRAMUSCULAR | Status: AC
  Administered 2015-10-06: 1 mg via INTRAVENOUS
  Filled 2015-10-06: qty 1

## 2015-10-06 MED ORDER — BARIUM SULFATE 2.1 % PO SUSP
ORAL | Status: AC
  Administered 2015-10-06: 450 mL
  Filled 2015-10-06: qty 1

## 2015-10-06 MED ORDER — IOHEXOL 300 MG/ML  SOLN
80.0000 mL | Freq: Once | INTRAMUSCULAR | Status: AC | PRN
Start: 2015-10-06 — End: 2015-10-06
  Administered 2015-10-06: 80 mL via INTRAVENOUS

## 2015-10-06 MED ORDER — GI COCKTAIL ~~LOC~~
30.0000 mL | Freq: Once | ORAL | Status: AC
  Administered 2015-10-06: 30 mL via ORAL
  Filled 2015-10-06: qty 30

## 2015-10-06 NOTE — ED Notes (Signed)
Pt witnessed leaving department by MD Rhunette CroftNanavati. Pt did not sign, but was placed up for discharge by MD. Rhunette CroftNanavati spoke with patient prior to him leaving. Pt was a/o x4. Ambulatory with steady gait. NAD.

## 2015-10-06 NOTE — ED Notes (Signed)
Pt has been having increased abdominal pain and vomiting. Pt has history of pancreatitis and believes it has flared up.
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
## 2015-10-06 NOTE — ED Notes (Signed)
Pt speaking of leaving AMA but states "I'm going to wait a few more minutes to see if the CT is back yet but I'm not going to sit here in pain when I have pain meds at home." pt IV removed at this time.

## 2015-12-09 ENCOUNTER — Emergency Department

## 2015-12-09 ENCOUNTER — Encounter: Payer: Self-pay | Admitting: Emergency Medicine

## 2015-12-09 ENCOUNTER — Emergency Department
Admission: EM | Admit: 2015-12-09 | Discharge: 2015-12-09 | Disposition: A | Discharge status: Home / Self Care | Attending: Student | Admitting: Student

## 2015-12-09 DIAGNOSIS — Z79899 Other long term (current) drug therapy: Secondary | ICD-10-CM | POA: Diagnosis not present

## 2015-12-09 DIAGNOSIS — R1012 Left upper quadrant pain: Secondary | ICD-10-CM | POA: Diagnosis not present

## 2015-12-09 DIAGNOSIS — F17200 Nicotine dependence, unspecified, uncomplicated: Secondary | ICD-10-CM | POA: Insufficient documentation

## 2015-12-09 DIAGNOSIS — G8929 Other chronic pain: Secondary | ICD-10-CM | POA: Diagnosis not present

## 2015-12-09 DIAGNOSIS — R0602 Shortness of breath: Secondary | ICD-10-CM | POA: Diagnosis not present

## 2015-12-09 DIAGNOSIS — Z7952 Long term (current) use of systemic steroids: Secondary | ICD-10-CM | POA: Diagnosis not present

## 2015-12-09 DIAGNOSIS — I10 Essential (primary) hypertension: Secondary | ICD-10-CM | POA: Diagnosis not present

## 2015-12-09 DIAGNOSIS — R1013 Epigastric pain: Secondary | ICD-10-CM | POA: Insufficient documentation

## 2015-12-09 DIAGNOSIS — R109 Unspecified abdominal pain: Secondary | ICD-10-CM

## 2015-12-09 HISTORY — DX: Acute pancreatitis without necrosis or infection, unspecified: K85.90

## 2015-12-09 HISTORY — DX: Sarcoidosis, unspecified: D86.9

## 2015-12-09 LAB — COMPREHENSIVE METABOLIC PANEL
ALT: 54 U/L (ref 17–63)
ANION GAP: 10 (ref 5–15)
AST: 28 U/L (ref 15–41)
Albumin: 4.4 g/dL (ref 3.5–5.0)
Alkaline Phosphatase: 107 U/L (ref 38–126)
BUN: 16 mg/dL (ref 6–20)
CHLORIDE: 100 mmol/L — AB (ref 101–111)
CO2: 28 mmol/L (ref 22–32)
Calcium: 9.2 mg/dL (ref 8.9–10.3)
Creatinine, Ser: 0.93 mg/dL (ref 0.61–1.24)
GFR calc non Af Amer: >60 mL/min (ref >60)
Glucose, Bld: 97 mg/dL (ref 65–99)
Potassium: 3.8 mmol/L (ref 3.5–5.1)
SODIUM: 138 mmol/L (ref 135–145)
Total Bilirubin: 0.3 mg/dL (ref 0.3–1.2)
Total Protein: 7.4 g/dL (ref 6.5–8.1)

## 2015-12-09 LAB — CBC
HEMATOCRIT: 43.2 % (ref 40.0–52.0)
HEMOGLOBIN: 14.7 g/dL (ref 13.0–18.0)
MCH: 29.3 pg (ref 26.0–34.0)
MCHC: 34.1 g/dL (ref 32.0–36.0)
MCV: 86 fL (ref 80.0–100.0)
Platelets: 301 10*3/uL (ref 150–440)
RBC: 5.02 MIL/uL (ref 4.40–5.90)
RDW: 14.4 % (ref 11.5–14.5)
WBC: 11 10*3/uL — ABNORMAL HIGH (ref 3.8–10.6)

## 2015-12-09 LAB — URINALYSIS COMPLETE WITH MICROSCOPIC (ARMC ONLY)
Bacteria, UA: NONE SEEN
Bilirubin Urine: NEGATIVE
Glucose, UA: NEGATIVE mg/dL
Hgb urine dipstick: NEGATIVE
Ketones, ur: NEGATIVE mg/dL
Leukocytes, UA: NEGATIVE
Nitrite: NEGATIVE
PROTEIN: NEGATIVE mg/dL
SPECIFIC GRAVITY, URINE: 1.026 (ref 1.005–1.030)
SQUAMOUS EPITHELIAL / LPF: NONE SEEN
pH: 6 (ref 5.0–8.0)

## 2015-12-09 LAB — LIPASE, BLOOD: LIPASE: 48 U/L (ref 11–51)

## 2015-12-09 LAB — TROPONIN I

## 2015-12-09 MED ORDER — HYDROMORPHONE HCL 1 MG/ML IJ SOLN
1.0000 mg | Freq: Once | INTRAMUSCULAR | Status: AC
  Administered 2015-12-09: 1 mg via INTRAMUSCULAR

## 2015-12-09 MED ORDER — HYDROMORPHONE HCL 1 MG/ML IJ SOLN
INTRAMUSCULAR | Status: AC
  Administered 2015-12-09: 1 mg via INTRAMUSCULAR
  Filled 2015-12-09: qty 1
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
## 2015-12-09 NOTE — ED Notes (Signed)
Pt left AMA without nurses knowledge. Gown on rooms floor and pt not in room

## 2015-12-09 NOTE — ED Notes (Addendum)
Pt reports that breath has been smelling fruity, no hx of diabetes. Increased fatigue and increased thirst, some difficulty urinating recently and diaphoresis. Reports abd pain with distention for past couple months. Hx of pancreatitis, kidney disease and  also had gallbladder removed. Pt also reports it is too painful to eat because of sharp pains in stomach and behind kidneys all the way into legs, reports that recently pills have been getting stuck in his throat.

## 2015-12-09 NOTE — ED Notes (Signed)
Pt to ed with c/o abd pain, sob, fatigue and weakness.  Pt states was told to come to ed for eval by PMD.  Hx of pancreatitis.

## 2016-01-20 ENCOUNTER — Emergency Department

## 2016-01-20 DIAGNOSIS — G8929 Other chronic pain: Secondary | ICD-10-CM | POA: Insufficient documentation

## 2016-01-20 DIAGNOSIS — R079 Chest pain, unspecified: Secondary | ICD-10-CM | POA: Insufficient documentation

## 2016-01-20 DIAGNOSIS — I252 Old myocardial infarction: Secondary | ICD-10-CM | POA: Diagnosis not present

## 2016-01-20 DIAGNOSIS — R1012 Left upper quadrant pain: Secondary | ICD-10-CM | POA: Insufficient documentation

## 2016-01-20 DIAGNOSIS — Z79899 Other long term (current) drug therapy: Secondary | ICD-10-CM | POA: Diagnosis not present

## 2016-01-20 DIAGNOSIS — Z8673 Personal history of transient ischemic attack (TIA), and cerebral infarction without residual deficits: Secondary | ICD-10-CM | POA: Insufficient documentation

## 2016-01-20 DIAGNOSIS — R55 Syncope and collapse: Secondary | ICD-10-CM | POA: Diagnosis not present

## 2016-01-20 DIAGNOSIS — E785 Hyperlipidemia, unspecified: Secondary | ICD-10-CM | POA: Diagnosis not present

## 2016-01-20 DIAGNOSIS — F17200 Nicotine dependence, unspecified, uncomplicated: Secondary | ICD-10-CM | POA: Diagnosis not present

## 2016-01-20 DIAGNOSIS — Z8679 Personal history of other diseases of the circulatory system: Secondary | ICD-10-CM | POA: Insufficient documentation

## 2016-01-20 DIAGNOSIS — I10 Essential (primary) hypertension: Secondary | ICD-10-CM | POA: Insufficient documentation

## 2016-01-20 LAB — CBC
HEMATOCRIT: 42.3 % (ref 40.0–52.0)
HEMOGLOBIN: 14.5 g/dL (ref 13.0–18.0)
MCH: 29.7 pg (ref 26.0–34.0)
MCHC: 34.3 g/dL (ref 32.0–36.0)
MCV: 86.4 fL (ref 80.0–100.0)
Platelets: 272 10*3/uL (ref 150–440)
RBC: 4.9 MIL/uL (ref 4.40–5.90)
RDW: 13.1 % (ref 11.5–14.5)
WBC: 13.2 10*3/uL — ABNORMAL HIGH (ref 3.8–10.6)

## 2016-01-20 LAB — BASIC METABOLIC PANEL
ANION GAP: 9 (ref 5–15)
BUN: 24 mg/dL — AB (ref 6–20)
CHLORIDE: 93 mmol/L — AB (ref 101–111)
CO2: 31 mmol/L (ref 22–32)
Calcium: 10.1 mg/dL (ref 8.9–10.3)
Creatinine, Ser: 0.94 mg/dL (ref 0.61–1.24)
GFR calc Af Amer: >60 mL/min (ref >60)
GLUCOSE: 119 mg/dL — AB (ref 65–99)
POTASSIUM: 4.5 mmol/L (ref 3.5–5.1)
Sodium: 133 mmol/L — ABNORMAL LOW (ref 135–145)

## 2016-01-20 LAB — TROPONIN I: Troponin I: <0.03 ng/mL (ref <0.031)
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
## 2016-01-21 ENCOUNTER — Emergency Department
Admission: EM | Admit: 2016-01-21 | Discharge: 2016-01-21 | Disposition: A | Discharge status: Home / Self Care | Attending: Emergency Medicine | Admitting: Emergency Medicine

## 2016-01-21 DIAGNOSIS — R55 Syncope and collapse: Secondary | ICD-10-CM

## 2016-01-21 DIAGNOSIS — G8929 Other chronic pain: Secondary | ICD-10-CM

## 2016-01-21 DIAGNOSIS — R1012 Left upper quadrant pain: Secondary | ICD-10-CM

## 2016-01-21 DIAGNOSIS — R079 Chest pain, unspecified: Secondary | ICD-10-CM

## 2016-01-21 LAB — HEPATIC FUNCTION PANEL
ALBUMIN: 4.4 g/dL (ref 3.5–5.0)
ALK PHOS: 75 U/L (ref 38–126)
ALT: 53 U/L (ref 17–63)
AST: 30 U/L (ref 15–41)
Bilirubin, Direct: <0.1 mg/dL — ABNORMAL LOW (ref 0.1–0.5)
TOTAL PROTEIN: 7.3 g/dL (ref 6.5–8.1)
Total Bilirubin: 0.4 mg/dL (ref 0.3–1.2)

## 2016-01-21 LAB — LIPASE, BLOOD: LIPASE: 33 U/L (ref 11–51)

## 2016-01-21 MED ORDER — HYDROMORPHONE HCL 1 MG/ML IJ SOLN
2.0000 mg | Freq: Once | INTRAMUSCULAR | Status: AC
  Administered 2016-01-21: 2 mg via INTRAMUSCULAR
  Filled 2016-01-21: qty 2

## 2016-01-21 MED ORDER — ONDANSETRON 4 MG PO TBDP
4.0000 mg | ORAL_TABLET | Freq: Once | ORAL | Status: AC
  Administered 2016-01-21: 4 mg via ORAL
  Filled 2016-01-21: qty 1

## 2016-01-21 NOTE — Discharge Instructions (Signed)
1. Continue all medicines as directed by your doctor. 2. Return to the ER for worsening symptoms, persistent vomiting, difficulty breathing or other concerns.  Nonspecific Chest Pain  Chest pain can be caused by many different conditions. There is always a chance that your pain could be related to something serious, such as a heart attack or a blood clot in your lungs. Chest pain can also be caused by conditions that are not life-threatening. If you have chest pain, it is very important to follow up with your health care provider. CAUSES  Chest pain can be caused by:  Heartburn.  Pneumonia or bronchitis.  Anxiety or stress.  Inflammation around your heart (pericarditis) or lung (pleuritis or pleurisy).  A blood clot in your lung.  A collapsed lung (pneumothorax). It can develop suddenly on its own (spontaneous pneumothorax) or from trauma to the chest.  Shingles infection (varicella-zoster virus).  Heart attack.  Damage to the bones, muscles, and cartilage that make up your chest wall. This can include:  Bruised bones due to injury.  Strained muscles or cartilage due to frequent or repeated coughing or overwork.  Fracture to one or more ribs.  Sore cartilage due to inflammation (costochondritis). RISK FACTORS  Risk factors for chest pain may include:  Activities that increase your risk for trauma or injury to your chest.  Respiratory infections or conditions that cause frequent coughing.  Medical conditions or overeating that can cause heartburn.  Heart disease or family history of heart disease.  Conditions or health behaviors that increase your risk of developing a blood clot.  Having had chicken pox (varicella zoster). SIGNS AND SYMPTOMS Chest pain can feel like:  Burning or tingling on the surface of your chest or deep in your chest.  Crushing, pressure, aching, or squeezing pain.  Dull or sharp pain that is worse when you move, cough, or take a deep  breath.  Pain that is also felt in your back, neck, shoulder, or arm, or pain that spreads to any of these areas. Your chest pain may come and go, or it may stay constant. DIAGNOSIS Lab tests or other studies may be needed to find the cause of your pain. Your health care provider may have you take a test called an ambulatory ECG (electrocardiogram). An ECG records your heartbeat patterns at the time the test is performed. You may also have other tests, such as:  Transthoracic echocardiogram (TTE). During echocardiography, sound waves are used to create a picture of all of the heart structures and to look at how blood flows through your heart.  Transesophageal echocardiogram (TEE).This is a more advanced imaging test that obtains images from inside your body. It allows your health care provider to see your heart in finer detail.  Cardiac monitoring. This allows your health care provider to monitor your heart rate and rhythm in real time.  Holter monitor. This is a portable device that records your heartbeat and can help to diagnose abnormal heartbeats. It allows your health care provider to track your heart activity for several days, if needed.  Stress tests. These can be done through exercise or by taking medicine that makes your heart beat more quickly.  Blood tests.  Imaging tests. TREATMENT  Your treatment depends on what is causing your chest pain. Treatment may include:  Medicines. These may include:  Acid blockers for heartburn.  Anti-inflammatory medicine.  Pain medicine for inflammatory conditions.  Antibiotic medicine, if an infection is present.  Medicines to dissolve blood clots.  Medicines to treat coronary artery disease.  Supportive care for conditions that do not require medicines. This may include:  Resting.  Applying heat or cold packs to injured areas.  Limiting activities until pain decreases. HOME CARE INSTRUCTIONS  If you were prescribed an  antibiotic medicine, finish it all even if you start to feel better.  Avoid any activities that bring on chest pain.  Do not use any tobacco products, including cigarettes, chewing tobacco, or electronic cigarettes. If you need help quitting, ask your health care provider.  Do not drink alcohol.  Take medicines only as directed by your health care provider.  Keep all follow-up visits as directed by your health care provider. This is important. This includes any further testing if your chest pain does not go away.  If heartburn is the cause for your chest pain, you may be told to keep your head raised (elevated) while sleeping. This reduces the chance that acid will go from your stomach into your esophagus.  Make lifestyle changes as directed by your health care provider. These may include:  Getting regular exercise. Ask your health care provider to suggest some activities that are safe for you.  Eating a heart-healthy diet. A registered dietitian can help you to learn healthy eating options.  Maintaining a healthy weight.  Managing diabetes, if necessary.  Reducing stress. SEEK MEDICAL CARE IF:  Your chest pain does not go away after treatment.  You have a rash with blisters on your chest.  You have a fever. SEEK IMMEDIATE MEDICAL CARE IF:   Your chest pain is worse.  You have an increasing cough, or you cough up blood.  You have severe abdominal pain.  You have severe weakness.  You faint.  You have chills.  You have sudden, unexplained chest discomfort.  You have sudden, unexplained discomfort in your arms, back, neck, or jaw.  You have shortness of breath at any time.  You suddenly start to sweat, or your skin gets clammy.  You feel nauseous or you vomit.  You suddenly feel light-headed or dizzy.  Your heart begins to beat quickly, or it feels like it is skipping beats. These symptoms may represent a serious problem that is an emergency. Do not wait to  see if the symptoms will go away. Get medical help right away. Call your local emergency services (911 in the [place] Do not drive yourself to the hospital.   This information is not intended to replace advice given to you by your health care provider. Make sure you discuss any questions you have with your health care provider.   Document Released: 07/14/2005 Document Revised: 10/25/2014 Document Reviewed: 05/10/2014 Elsevier Interactive Patient Education 2016 Else[employer] Inc[employer]  Abdominal Pain, Adult Many things can cause abdominal pain. Usually, abdominal pain is not caused by a disease and will improve without treatment. It can often be observed and treated at home. Your health care provider will do a physical exam and possibly order blood tests and X-rays to help determine the seriousness of your pain. However, in many cases, more time must pass before a clear cause of the pain can be found. Before that point, your health care provider may not know if you need more testing or further treatment. HOME CARE INSTRUCTIONS Monitor your abdominal pain for any changes. The following actions may help to alleviate any discomfort you are experiencing:  Only take over-the-counter or prescription medicines as directed by your health care provider.  Do not take laxatives unless directed  to do so by your health care provider.  Try a clear liquid diet (broth, tea, or water) as directed by your health care provider. Slowly move to a bland diet as tolerated. SEEK MEDICAL CARE IF:  You have unexplained abdominal pain.  You have abdominal pain associated with nausea or diarrhea.  You have pain when you urinate or have a bowel movement.  You experience abdominal pain that wakes you in the night.  You have abdominal pain that is worsened or improved by eating food.  You have abdominal pain that is worsened with eating fatty foods.  You have a fever. SEEK IMMEDIATE MEDICAL CARE IF:  Your pain does not go  away within 2 hours.  You keep throwing up (vomiting).  Your pain is felt only in portions of the abdomen, such as the right side or the left lower portion of the abdomen.  You pass bloody or black tarry stools. MAKE SURE YOU:  Understand these instructions.  Will watch your condition.  Will get help right away if you are not doing well or get worse.   This information is not intended to replace advice given to you by your health care provider. Make sure you discuss any questions you have with your health care provider.   Document Released: 07/14/2005 Document Revised: 06/25/2015 Document Reviewed: 06/13/2013 Elsevier Interactive Patient Education 2016 [employer].  Syncope Syncope is a medical term for fainting or passing out. This means you lose consciousness and drop to the ground. People are generally unconscious for less than 5 minutes. You may have some muscle twitches for up to 15 seconds before waking up and returning to normal. Syncope occurs more often in older adults, but it can happen to anyone. While most causes of syncope are not dangerous, syncope can be a sign of a serious medical problem. It is important to seek medical care.  CAUSES  Syncope is caused by a sudden drop in blood flow to the brain. The specific cause is often not determined. Factors that can bring on syncope include:  Taking medicines that lower blood pressure.  Sudden changes in posture, such as standing up quickly.  Taking more medicine than prescribed.  Standing in one place for too long.  Seizure disorders.  Dehydration and excessive exposure to heat.  Low blood sugar (hypoglycemia).  Straining to have a bowel movement.  Heart disease, irregular heartbeat, or other circulatory problems.  Fear, emotional distress, seeing blood, or severe pain. SYMPTOMS  Right before fainting, you may:  Feel dizzy or light-headed.  Feel nauseous.  See all white or all black in your field of  vision.  Have cold, clammy skin. DIAGNOSIS  Your health care provider will ask about your symptoms, perform a physical exam, and perform an electrocardiogram (ECG) to record the electrical activity of your heart. Your health care provider may also perform other heart or blood tests to determine the cause of your syncope which may include:  Transthoracic echocardiogram (TTE). During echocardiography, sound waves are used to evaluate how blood flows through your heart.  Transesophageal echocardiogram (TEE).  Cardiac monitoring. This allows your health care provider to monitor your heart rate and rhythm in real time.  Holter monitor. This is a portable device that records your heartbeat and can help diagnose heart arrhythmias. It allows your health care provider to track your heart activity for several days, if needed.  Stress tests by exercise or by giving medicine that makes the heart beat faster. TREATMENT  In  most cases, no treatment is needed. Depending on the cause of your syncope, your health care provider may recommend changing or stopping some of your medicines. HOME CARE INSTRUCTIONS  Have someone stay with you until you feel stable.  Do not drive, use machinery, or play sports until your health care provider says it is okay.  Keep all follow-up appointments as directed by your health care provider.  Lie down right away if you start feeling like you might faint. Breathe deeply and steadily. Wait until all the symptoms have passed.  Drink enough fluids to keep your urine clear or pale yellow.  If you are taking blood pressure or heart medicine, get up slowly and take several minutes to sit and then stand. This can reduce dizziness. SEEK IMMEDIATE MEDICAL CARE IF:   You have a severe headache.  You have unusual pain in the chest, abdomen, or back.  You are bleeding from your mouth or rectum, or you have black or tarry stool.  You have an irregular or very fast  heartbeat.  You have pain with breathing.  You have repeated fainting or seizure-like jerking during an episode.  You faint when sitting or lying down.  You have confusion.  You have trouble walking.  You have severe weakness.  You have vision problems. If you fainted, call your local emergency services (911 in [place] Do not drive yourself to the hospital.    This information is not intended to replace advice given to you by your health care provider. Make sure you discuss any questions you have with your health care provider.   Document Released: 10/04/2005 Document Revised: 02/18/2015 Document Reviewed: 12/03/2011 Elsevier Interactive Patient Education [employer]2016 Elsevier Inc.
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
## 2016-03-02 ENCOUNTER — Emergency Department
Admission: EM | Admit: 2016-03-02 | Discharge: 2016-03-02 | Disposition: A | Discharge status: Home / Self Care | Attending: Emergency Medicine | Admitting: Emergency Medicine

## 2016-03-02 ENCOUNTER — Encounter: Payer: Self-pay | Admitting: Emergency Medicine

## 2016-03-02 ENCOUNTER — Emergency Department

## 2016-03-02 DIAGNOSIS — R079 Chest pain, unspecified: Secondary | ICD-10-CM | POA: Insufficient documentation

## 2016-03-02 DIAGNOSIS — R1013 Epigastric pain: Secondary | ICD-10-CM | POA: Diagnosis not present

## 2016-03-02 DIAGNOSIS — I10 Essential (primary) hypertension: Secondary | ICD-10-CM | POA: Diagnosis not present

## 2016-03-02 DIAGNOSIS — Z8673 Personal history of transient ischemic attack (TIA), and cerebral infarction without residual deficits: Secondary | ICD-10-CM | POA: Insufficient documentation

## 2016-03-02 DIAGNOSIS — I252 Old myocardial infarction: Secondary | ICD-10-CM | POA: Diagnosis not present

## 2016-03-02 DIAGNOSIS — Z79899 Other long term (current) drug therapy: Secondary | ICD-10-CM | POA: Diagnosis not present

## 2016-03-02 DIAGNOSIS — E785 Hyperlipidemia, unspecified: Secondary | ICD-10-CM | POA: Insufficient documentation

## 2016-03-02 DIAGNOSIS — I2510 Atherosclerotic heart disease of native coronary artery without angina pectoris: Secondary | ICD-10-CM | POA: Diagnosis not present

## 2016-03-02 DIAGNOSIS — R109 Unspecified abdominal pain: Secondary | ICD-10-CM

## 2016-03-02 DIAGNOSIS — F17200 Nicotine dependence, unspecified, uncomplicated: Secondary | ICD-10-CM | POA: Diagnosis not present

## 2016-03-02 HISTORY — DX: Acute myocardial infarction, unspecified: I21.9

## 2016-03-02 LAB — CBC
HCT: 41.5 % (ref 40.0–52.0)
Hemoglobin: 14.3 g/dL (ref 13.0–18.0)
MCH: 29.1 pg (ref 26.0–34.0)
MCHC: 34.5 g/dL (ref 32.0–36.0)
MCV: 84.5 fL (ref 80.0–100.0)
PLATELETS: 255 10*3/uL (ref 150–440)
RBC: 4.91 MIL/uL (ref 4.40–5.90)
RDW: 13.1 % (ref 11.5–14.5)
WBC: 9.4 10*3/uL (ref 3.8–10.6)

## 2016-03-02 LAB — HEPATIC FUNCTION PANEL
ALK PHOS: 68 U/L (ref 38–126)
ALT: 36 U/L (ref 17–63)
AST: 25 U/L (ref 15–41)
Albumin: 4.1 g/dL (ref 3.5–5.0)
BILIRUBIN TOTAL: 0.6 mg/dL (ref 0.3–1.2)
Total Protein: 6.7 g/dL (ref 6.5–8.1)

## 2016-03-02 LAB — BASIC METABOLIC PANEL
Anion gap: 10 (ref 5–15)
BUN: 12 mg/dL (ref 6–20)
CALCIUM: 9.2 mg/dL (ref 8.9–10.3)
CO2: 23 mmol/L (ref 22–32)
CREATININE: 1.03 mg/dL (ref 0.61–1.24)
Chloride: 101 mmol/L (ref 101–111)
GFR calc non Af Amer: >60 mL/min (ref >60)
GLUCOSE: 140 mg/dL — AB (ref 65–99)
Potassium: 3.3 mmol/L — ABNORMAL LOW (ref 3.5–5.1)
Sodium: 134 mmol/L — ABNORMAL LOW (ref 135–145)

## 2016-03-02 LAB — LIPASE, BLOOD: Lipase: 46 U/L (ref 11–51)

## 2016-03-02 LAB — TROPONIN I: Troponin I: <0.03 ng/mL (ref <0.031)

## 2016-03-02 MED ORDER — KETOROLAC TROMETHAMINE 60 MG/2ML IM SOLN
60.0000 mg | Freq: Once | INTRAMUSCULAR | Status: DC
  Filled 2016-03-02: qty 2

## 2016-03-02 NOTE — ED Notes (Signed)
MD at bedside. 

## 2016-03-02 NOTE — ED Notes (Signed)
Pt reports not happy with care just wants to leave.

## 2016-03-02 NOTE — ED Notes (Signed)
MD at bedside discussing D/C

## 2016-03-02 NOTE — ED Notes (Signed)
Family at bedside. 
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
## 2016-03-02 NOTE — Discharge Instructions (Signed)

## 2016-03-02 NOTE — ED Notes (Signed)
Pt to ed with c/o chest pain that started this afternoon.  Reports single episode that radiated into back and neck.  Pt states he took an asa and then felt nauseated, chest pain resolved but he became diaphoretic and became weak and pale.  Pt reports Mi in the past and this felt the same.

## 2016-06-23 ENCOUNTER — Emergency Department (HOSPITAL_COMMUNITY)

## 2016-06-23 ENCOUNTER — Emergency Department (HOSPITAL_COMMUNITY)
Admission: EM | Admit: 2016-06-23 | Discharge: 2016-06-23 | Disposition: A | Discharge status: Home / Self Care | Attending: Emergency Medicine | Admitting: Emergency Medicine

## 2016-06-23 ENCOUNTER — Encounter (HOSPITAL_COMMUNITY): Payer: Self-pay | Admitting: *Deleted

## 2016-06-23 DIAGNOSIS — I252 Old myocardial infarction: Secondary | ICD-10-CM | POA: Insufficient documentation

## 2016-06-23 DIAGNOSIS — F17200 Nicotine dependence, unspecified, uncomplicated: Secondary | ICD-10-CM | POA: Diagnosis not present

## 2016-06-23 DIAGNOSIS — Z8673 Personal history of transient ischemic attack (TIA), and cerebral infarction without residual deficits: Secondary | ICD-10-CM | POA: Insufficient documentation

## 2016-06-23 DIAGNOSIS — R1013 Epigastric pain: Secondary | ICD-10-CM | POA: Diagnosis not present

## 2016-06-23 DIAGNOSIS — R079 Chest pain, unspecified: Secondary | ICD-10-CM | POA: Diagnosis not present

## 2016-06-23 DIAGNOSIS — I10 Essential (primary) hypertension: Secondary | ICD-10-CM | POA: Insufficient documentation

## 2016-06-23 DIAGNOSIS — I2510 Atherosclerotic heart disease of native coronary artery without angina pectoris: Secondary | ICD-10-CM | POA: Diagnosis not present

## 2016-06-23 LAB — BASIC METABOLIC PANEL
Anion gap: 15 (ref 5–15)
BUN: 9 mg/dL (ref 6–20)
CHLORIDE: 99 mmol/L — AB (ref 101–111)
CO2: 24 mmol/L (ref 22–32)
CREATININE: 1 mg/dL (ref 0.61–1.24)
Calcium: 9.1 mg/dL (ref 8.9–10.3)
GFR calc Af Amer: >60 mL/min (ref >60)
GFR calc non Af Amer: >60 mL/min (ref >60)
GLUCOSE: 101 mg/dL — AB (ref 65–99)
POTASSIUM: 3.2 mmol/L — AB (ref 3.5–5.1)
SODIUM: 138 mmol/L (ref 135–145)

## 2016-06-23 LAB — HEPATIC FUNCTION PANEL
ALK PHOS: 119 U/L (ref 38–126)
ALT: 74 U/L — AB (ref 17–63)
AST: 37 U/L (ref 15–41)
Albumin: 3.9 g/dL (ref 3.5–5.0)
BILIRUBIN DIRECT: 0.1 mg/dL (ref 0.1–0.5)
BILIRUBIN INDIRECT: 0.4 mg/dL (ref 0.3–0.9)
BILIRUBIN TOTAL: 0.5 mg/dL (ref 0.3–1.2)
Total Protein: 6.7 g/dL (ref 6.5–8.1)

## 2016-06-23 LAB — CBC
HEMATOCRIT: 43.8 % (ref 39.0–52.0)
Hemoglobin: 14.4 g/dL (ref 13.0–17.0)
MCH: 28.2 pg (ref 26.0–34.0)
MCHC: 32.9 g/dL (ref 30.0–36.0)
MCV: 85.7 fL (ref 78.0–100.0)
PLATELETS: 249 10*3/uL (ref 150–400)
RBC: 5.11 MIL/uL (ref 4.22–5.81)
RDW: 13.2 % (ref 11.5–15.5)
WBC: 6.4 10*3/uL (ref 4.0–10.5)

## 2016-06-23 LAB — I-STAT TROPONIN, ED
TROPONIN I, POC: 0 ng/mL (ref 0.00–0.08)
Troponin i, poc: 0 ng/mL (ref 0.00–0.08)

## 2016-06-23 LAB — D-DIMER, QUANTITATIVE (NOT AT ARMC): D DIMER QUANT: 0.51 ug{FEU}/mL — AB (ref 0.00–0.50)

## 2016-06-23 LAB — LIPASE, BLOOD: Lipase: 41 U/L (ref 11–51)

## 2016-06-23 MED ORDER — ONDANSETRON HCL 4 MG/2ML IJ SOLN
4.0000 mg | Freq: Once | INTRAMUSCULAR | Status: AC | PRN
  Administered 2016-06-23: 4 mg via INTRAVENOUS
  Filled 2016-06-23: qty 2

## 2016-06-23 MED ORDER — IOPAMIDOL (ISOVUE-370) INJECTION 76%
INTRAVENOUS | Status: AC
  Administered 2016-06-23: 80 mL
  Filled 2016-06-23: qty 100

## 2016-06-23 MED ORDER — MORPHINE SULFATE (PF) 4 MG/ML IV SOLN
4.0000 mg | Freq: Once | INTRAVENOUS | Status: AC
  Administered 2016-06-23: 4 mg via INTRAVENOUS
  Filled 2016-06-23: qty 1

## 2016-06-23 MED ORDER — POTASSIUM CHLORIDE CRYS ER 20 MEQ PO TBCR
40.0000 meq | EXTENDED_RELEASE_TABLET | Freq: Once | ORAL | Status: AC
  Administered 2016-06-23: 40 meq via ORAL
  Filled 2016-06-23: qty 2

## 2016-06-23 MED ORDER — IOPAMIDOL (ISOVUE-370) INJECTION 76%: 80.0000 mL | Freq: Once | INTRAVENOUS | Status: DC | PRN

## 2016-06-23 NOTE — Discharge Instructions (Signed)
Continue taking your home medications as prescribed. I recommend following up with a primary care provider offices listed above for follow-up within the next week if your symptoms continue. Please return to the Emergency Department if symptoms worsen or new onset of fever, lightheadedness, dizziness, difficulty breathing, coughing up blood, vomiting, unable to keep fluids down, abdominal pain, syncope.
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
## 2016-06-23 NOTE — ED Triage Notes (Signed)
Pt reports intermittent central chest pressure. Pt was dx with pancreatitis a week ago. Pt was seen at ER for similar symptoms a week ago. Pt states new onset of chest pressure tonight while walking up stairs. Pt took aspirin today.

## 2016-06-23 NOTE — ED Provider Notes (Signed)
[facility] angio chest pending.   See initial provider's note for full HPI. Briefly, pt is a 40yo M with PMH of chronic pancreatitis, CAD with normal heart cath in 2014, HTN and sarcoidosis who presents to the ED with complaint of intermittent left sided CP for the past 4 days. Endorses associated SOB. Pt was initially seen at outside hospital a few days ago, negative troponins and was d/c home.   Physical Exam  BP 138/99   Pulse 106   Temp 98.6 F (37 C) (Oral)   Resp 25   Ht 5\' 6"  (1.676 m)   Wt 97.1 kg   SpO2 100%   BMI 34.54 kg/m   Physical Exam  Constitutional: He is oriented to person, place, and time. He appears well-developed and well-nourished. No distress.  HENT:  Head: Normocephalic and atraumatic.  Eyes: Conjunctivae and EOM are normal. Right eye exhibits no discharge. Left eye exhibits no discharge. No scleral icterus.  Neck: Normal range of motion. Neck supple.  Cardiovascular: Normal rate, regular rhythm, normal heart sounds and intact distal pulses.   HR 88  Pulmonary/Chest: Effort normal and breath sounds normal. No respiratory distress. He has no wheezes. He has no rales. He exhibits no tenderness.  Abdominal: Soft. Bowel sounds are normal. He exhibits no distension. There is no tenderness.  Musculoskeletal: He exhibits no edema.  Neurological: He is alert and oriented to person, place, and time.  Skin: Skin is warm and dry. He is not diaphoretic.  Nursing note and vitals reviewed.   ED Course  Procedures  [facility] Patient presents with intermittent chest pain with associated shortness of breath that started 4 days ago. Patient initially mildly tachycardic on evaluation. Exam performed by initial provider revealed patient with tenderness over epigastric region which she reports is chronic associated with his chronic pancreatitis. Remaining exam unremarkable EKG showed sinus tachycardia, HR 104, with no significant changes from prior. Chest  x-ray negative. Potassium 3.2, patient given oral potassium supplement in the ED. Remaining labs unremarkable. Repeat troponin negative. D-dimer ordered due to concern for PE. D-dimer mildly elevated at 0.51. CT Angie her chest ordered for further evaluation of PE. Given pain meds.  On my initial evaluation, patient is resting comfortably in bed, heart rate 88. Patient reports his chest pain has mildly improved after pain meds. CT chest negative. Discussed results and plan for discharge with patient. Plan to discharge patient home with outpatient PCP follow-up. Discussed strict return precautions with patient.       Satira Sarkicole Elizabeth Los EbanosNadeau, PA-C 06/23/16 0848    Layla MawKristen N Ward, DO 06/23/16 2307

## 2016-08-12 IMAGING — CR DG CHEST 2V
1 series · 2 of 2 positions shown · non-contrast
Comparison: 10/31/2014

CLINICAL DATA: Weakness, shortness of Breath

EXAM:
CHEST  2 VIEW

[Series 1: dg chest 2 view · 0.14mm/px · 2 of 2 slices shown]
[im 1/2]
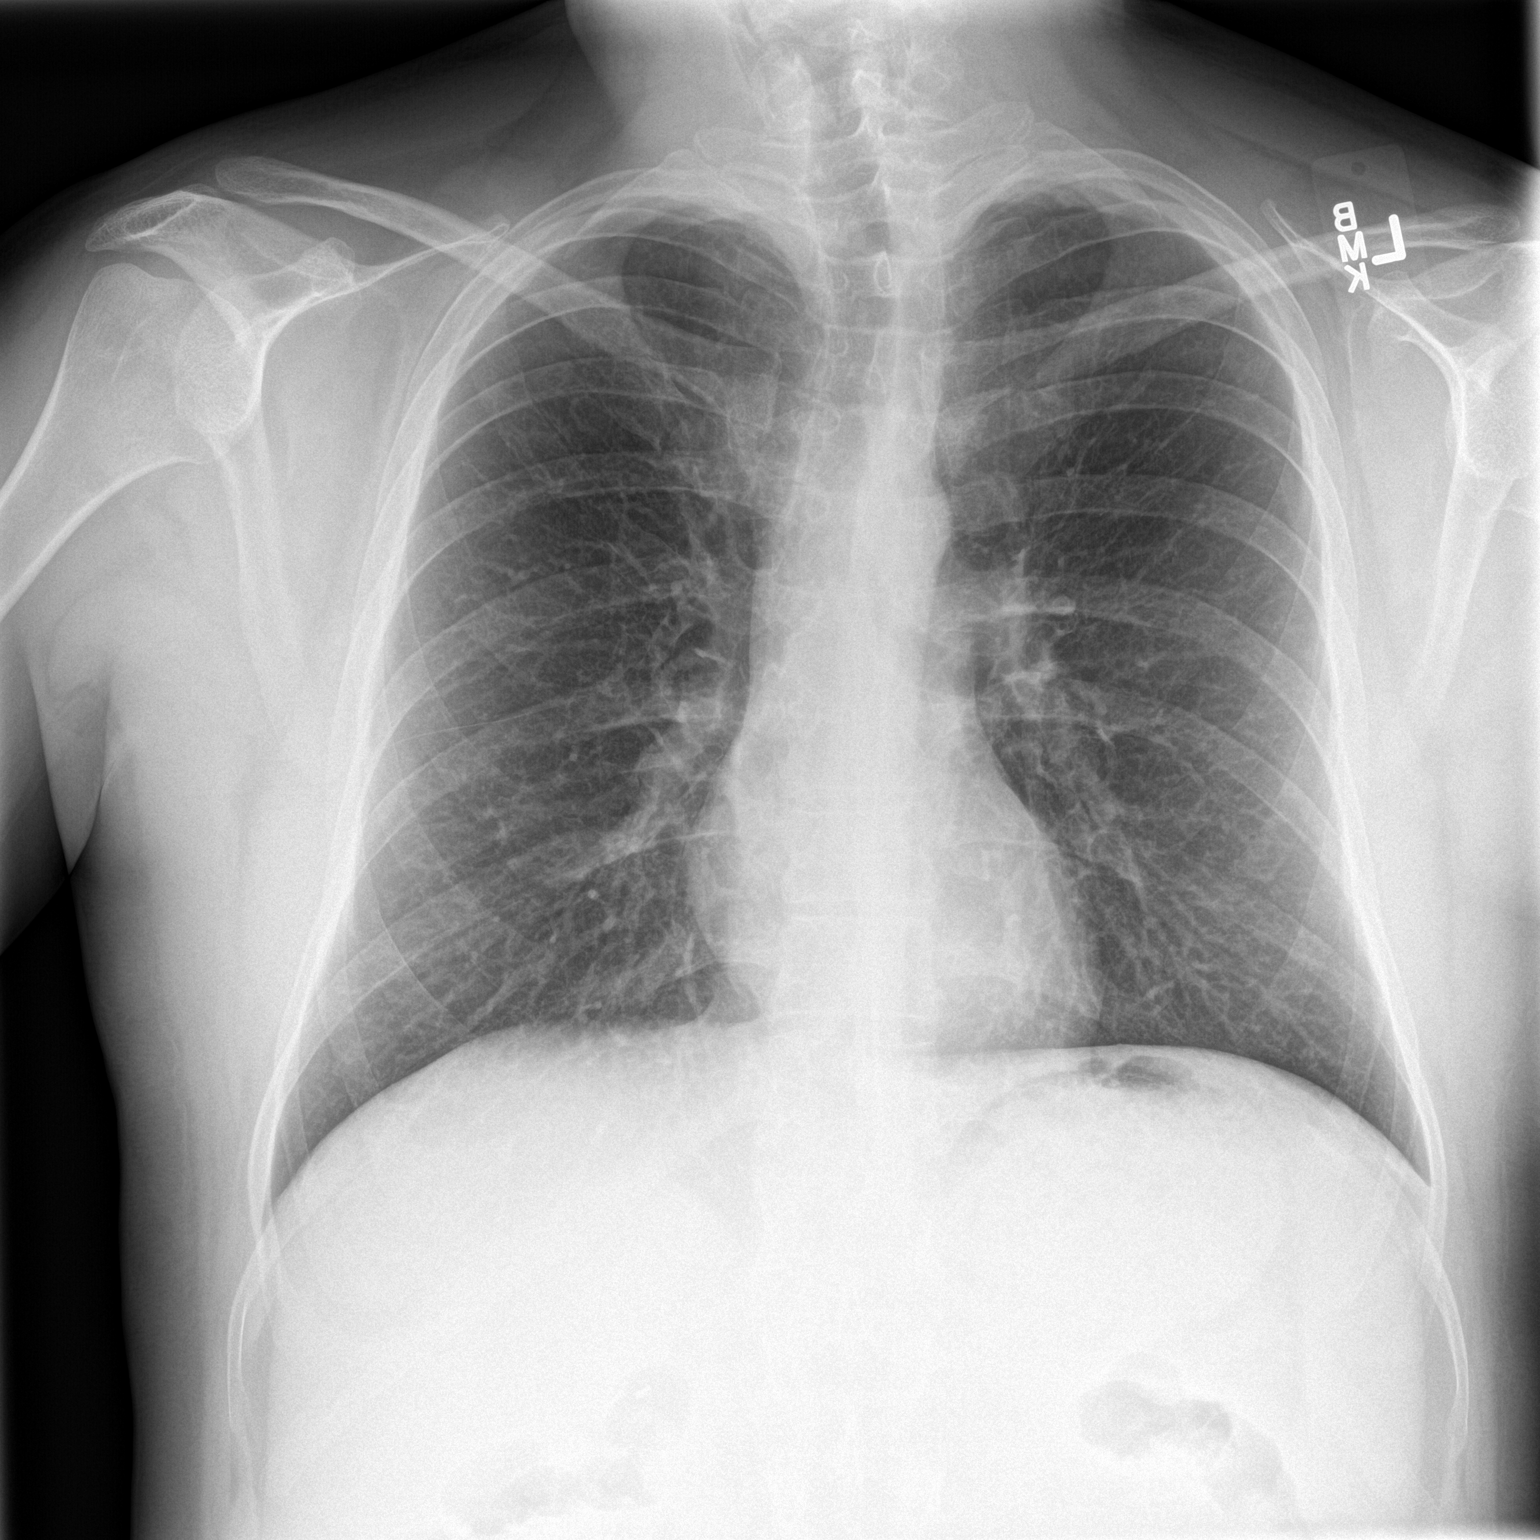
[im 2/2]
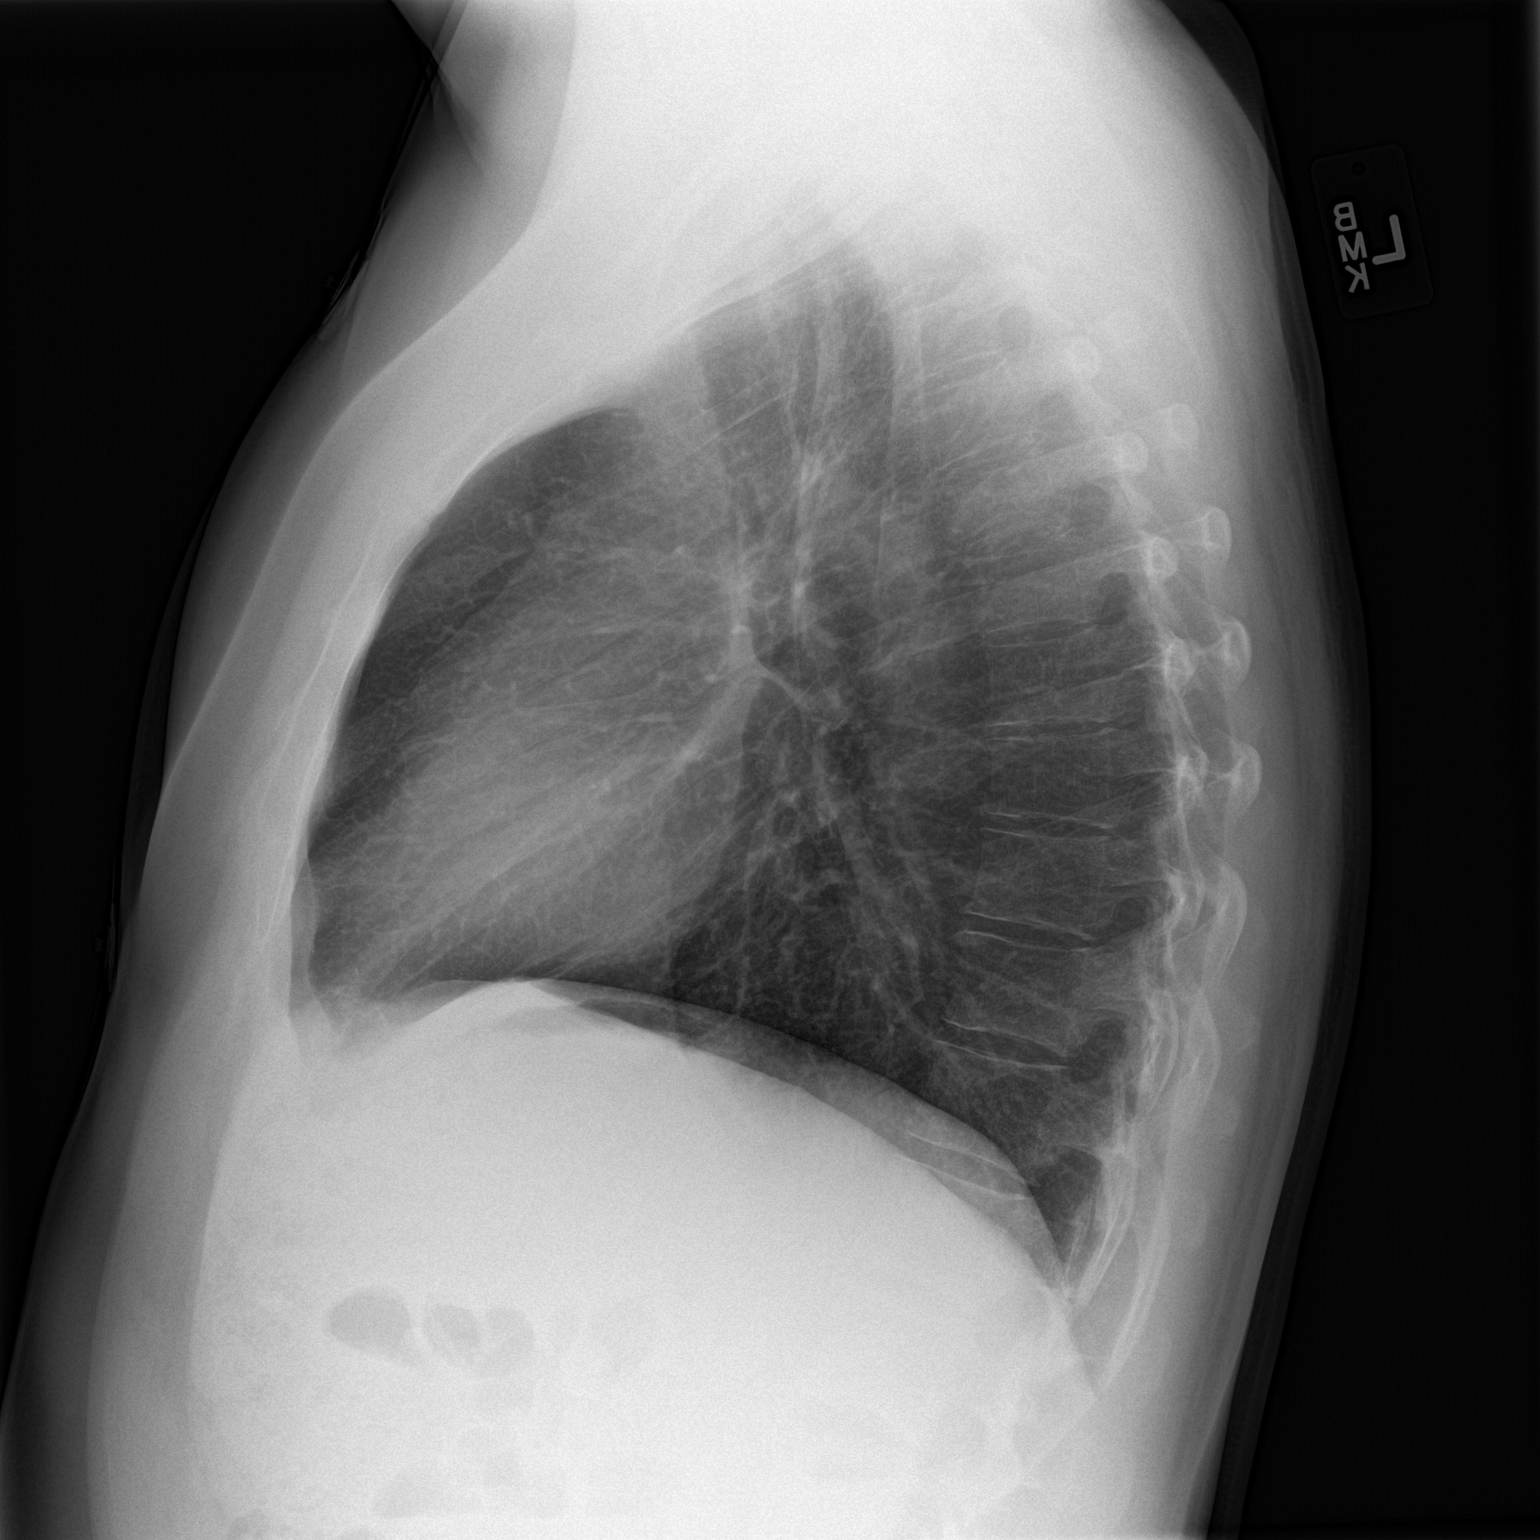

[2 of 2 positions shown; findings below may reference images not displayed]

FINDINGS: Cardiomediastinal silhouette is stable. No acute infiltrate or
pleural effusion. No pulmonary edema. Bony thorax is unremarkable.
IMPRESSION: No active cardiopulmonary disease.
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
## 2016-12-07 ENCOUNTER — Encounter: Payer: Self-pay | Admitting: Emergency Medicine

## 2016-12-07 ENCOUNTER — Emergency Department
Admission: EM | Admit: 2016-12-07 | Discharge: 2016-12-08 | Disposition: A | Discharge status: Home / Self Care | Payer: Non-veteran care | Attending: Emergency Medicine | Admitting: Emergency Medicine

## 2016-12-07 ENCOUNTER — Emergency Department: Payer: Non-veteran care

## 2016-12-07 DIAGNOSIS — R0602 Shortness of breath: Secondary | ICD-10-CM | POA: Diagnosis not present

## 2016-12-07 DIAGNOSIS — R0789 Other chest pain: Secondary | ICD-10-CM | POA: Insufficient documentation

## 2016-12-07 DIAGNOSIS — K861 Other chronic pancreatitis: Secondary | ICD-10-CM | POA: Diagnosis not present

## 2016-12-07 DIAGNOSIS — F17200 Nicotine dependence, unspecified, uncomplicated: Secondary | ICD-10-CM | POA: Insufficient documentation

## 2016-12-07 DIAGNOSIS — I10 Essential (primary) hypertension: Secondary | ICD-10-CM | POA: Diagnosis not present

## 2016-12-07 DIAGNOSIS — Z79899 Other long term (current) drug therapy: Secondary | ICD-10-CM | POA: Diagnosis not present

## 2016-12-07 DIAGNOSIS — R079 Chest pain, unspecified: Secondary | ICD-10-CM

## 2016-12-07 HISTORY — DX: Splenomegaly, not elsewhere classified: R16.1

## 2016-12-07 LAB — CBC
HCT: 37.7 % — ABNORMAL LOW (ref 40.0–52.0)
Hemoglobin: 13.3 g/dL (ref 13.0–18.0)
MCH: 28.7 pg (ref 26.0–34.0)
MCHC: 35.2 g/dL (ref 32.0–36.0)
MCV: 81.4 fL (ref 80.0–100.0)
PLATELETS: 205 10*3/uL (ref 150–440)
RBC: 4.63 MIL/uL (ref 4.40–5.90)
RDW: 14.3 % (ref 11.5–14.5)
WBC: 5.1 10*3/uL (ref 3.8–10.6)

## 2016-12-07 LAB — BASIC METABOLIC PANEL
Anion gap: 7 (ref 5–15)
BUN: 11 mg/dL (ref 6–20)
CHLORIDE: 106 mmol/L (ref 101–111)
CO2: 24 mmol/L (ref 22–32)
CREATININE: 1.02 mg/dL (ref 0.61–1.24)
Calcium: 8.4 mg/dL — ABNORMAL LOW (ref 8.9–10.3)
GFR calc Af Amer: >60 mL/min (ref >60)
GFR calc non Af Amer: >60 mL/min (ref >60)
Glucose, Bld: 111 mg/dL — ABNORMAL HIGH (ref 65–99)
Potassium: 3.9 mmol/L (ref 3.5–5.1)
SODIUM: 137 mmol/L (ref 135–145)

## 2016-12-07 LAB — TROPONIN I: Troponin I: <0.03 ng/mL (ref <0.03)

## 2016-12-07 NOTE — ED Notes (Signed)
Pt reports he has hx of MI - this [facility] and they said he needed a cardiologist consult d/t arhythmia  - also dx with pancreatitis flair today - about 7p pt became nauseous and began having numbness down left arm - then left arm/left shoulder/left side of neck began to hurt and feel like pressure - c/o chest pain under left breast - pt states short of breath for the last week - pt reports becoming irritable

## 2016-12-07 NOTE — ED Triage Notes (Signed)
Patient states he was seen at [facility] earlier today for abdominal pain and nausea, had EKG with some abnormalities. Patient states he has h/o MI, feels the same as previous MI. Patient states he also had severe diaphoresis prior to coming. Also reports pain and tingling down to left hand, pressure to jaw and left ear. Patient states on EKG earlier, he saw pauses of 4 seconds several times (patient previously worked in cardiology). States previous MI had normal EKG.

## 2016-12-08 LAB — BRAIN NATRIURETIC PEPTIDE: B Natriuretic Peptide: 34 pg/mL (ref 0.0–100.0)

## 2016-12-08 LAB — HEPATIC FUNCTION PANEL
ALK PHOS: 139 U/L — AB (ref 38–126)
ALT: 60 U/L (ref 17–63)
AST: 53 U/L — AB (ref 15–41)
Albumin: 3.7 g/dL (ref 3.5–5.0)
Bilirubin, Direct: <0.1 mg/dL — ABNORMAL LOW (ref 0.1–0.5)
TOTAL PROTEIN: 6.4 g/dL — AB (ref 6.5–8.1)
Total Bilirubin: 0.2 mg/dL — ABNORMAL LOW (ref 0.3–1.2)

## 2016-12-08 LAB — TROPONIN I: Troponin I: <0.03 ng/mL (ref <0.03)

## 2016-12-08 LAB — LIPASE, BLOOD: Lipase: 56 U/L — ABNORMAL HIGH (ref 11–51)

## 2016-12-08 MED ORDER — TRAMADOL HCL 50 MG PO TABS
50.0000 mg | ORAL_TABLET | Freq: Once | ORAL | Status: AC
  Administered 2016-12-08: 50 mg via ORAL
  Filled 2016-12-08: qty 1

## 2016-12-08 MED ORDER — TRAMADOL HCL 50 MG PO TABS: 50.0000 mg | ORAL_TABLET | Freq: Four times a day (QID) | ORAL | 0 refills | Status: AC | PRN
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
## 2016-12-08 NOTE — Discharge Instructions (Signed)
Please follow up with cardiology to evaluate your ongoing symptoms
